# Patient Record
Sex: Female | Born: 1960
Health system: Southern US, Community
[De-identification: ages and names within clinical notes are randomized; demographics above are authoritative.]

## PROBLEM LIST (undated history)

## (undated) DIAGNOSIS — K219 Gastro-esophageal reflux disease without esophagitis: Secondary | ICD-10-CM

## (undated) DIAGNOSIS — M1990 Unspecified osteoarthritis, unspecified site: Secondary | ICD-10-CM

## (undated) DIAGNOSIS — K224 Dyskinesia of esophagus: Secondary | ICD-10-CM

## (undated) DIAGNOSIS — Z973 Presence of spectacles and contact lenses: Secondary | ICD-10-CM

## (undated) HISTORY — PX: COLONOSCOPY: SHX174

---

## 1999-02-25 HISTORY — PX: AUGMENTATION MAMMAPLASTY: SUR837

## 2003-02-25 HISTORY — PX: BREAST ENHANCEMENT SURGERY: SHX7

## 2006-07-03 ENCOUNTER — Other Ambulatory Visit: Admission: RE | Admit: 2006-07-03 | Discharge: 2006-07-03 | Payer: Self-pay | Admitting: Family Medicine

## 2006-08-04 ENCOUNTER — Encounter: Admission: RE | Admit: 2006-08-04 | Discharge: 2006-08-04 | Payer: Self-pay | Admitting: Family Medicine

## 2006-08-10 ENCOUNTER — Encounter: Admission: RE | Admit: 2006-08-10 | Discharge: 2006-08-10 | Payer: Self-pay | Admitting: Family Medicine

## 2007-02-09 ENCOUNTER — Encounter: Admission: RE | Admit: 2007-02-09 | Discharge: 2007-02-09 | Payer: Self-pay | Admitting: Family Medicine

## 2007-10-08 ENCOUNTER — Emergency Department (HOSPITAL_COMMUNITY): Admission: EM | Admit: 2007-10-08 | Discharge: 2007-10-08 | Payer: Self-pay | Admitting: Emergency Medicine

## 2008-05-29 ENCOUNTER — Encounter: Admission: RE | Admit: 2008-05-29 | Discharge: 2008-05-29 | Payer: Self-pay | Admitting: Family Medicine

## 2008-12-21 ENCOUNTER — Encounter: Admission: RE | Admit: 2008-12-21 | Discharge: 2008-12-21 | Payer: Self-pay | Admitting: Family Medicine

## 2009-06-25 ENCOUNTER — Encounter: Admission: RE | Admit: 2009-06-25 | Discharge: 2009-06-25 | Payer: Self-pay | Admitting: Family Medicine

## 2009-09-19 ENCOUNTER — Other Ambulatory Visit: Admission: RE | Admit: 2009-09-19 | Discharge: 2009-09-19 | Payer: Self-pay | Admitting: Obstetrics and Gynecology

## 2010-03-17 ENCOUNTER — Encounter: Payer: Self-pay | Admitting: Otolaryngology

## 2010-03-17 ENCOUNTER — Encounter: Payer: Self-pay | Admitting: Family Medicine

## 2010-07-23 ENCOUNTER — Other Ambulatory Visit: Payer: Self-pay | Admitting: Family Medicine

## 2010-07-23 DIAGNOSIS — Z1231 Encounter for screening mammogram for malignant neoplasm of breast: Secondary | ICD-10-CM

## 2010-08-14 ENCOUNTER — Ambulatory Visit
Admission: RE | Admit: 2010-08-14 | Discharge: 2010-08-14 | Disposition: A | Discharge status: Home / Self Care | Payer: 59 | Source: Ambulatory Visit | Attending: Family Medicine | Admitting: Family Medicine

## 2010-08-14 DIAGNOSIS — Z1231 Encounter for screening mammogram for malignant neoplasm of breast: Secondary | ICD-10-CM

## 2011-10-13 ENCOUNTER — Other Ambulatory Visit: Payer: Self-pay | Admitting: Family Medicine

## 2011-10-13 DIAGNOSIS — Z1231 Encounter for screening mammogram for malignant neoplasm of breast: Secondary | ICD-10-CM

## 2011-10-30 ENCOUNTER — Ambulatory Visit
Admission: RE | Admit: 2011-10-30 | Discharge: 2011-10-30 | Disposition: A | Discharge status: Home / Self Care | Payer: 59 | Source: Ambulatory Visit | Attending: Family Medicine | Admitting: Family Medicine

## 2011-10-30 DIAGNOSIS — Z1231 Encounter for screening mammogram for malignant neoplasm of breast: Secondary | ICD-10-CM

## 2012-01-07 ENCOUNTER — Other Ambulatory Visit (HOSPITAL_COMMUNITY): Payer: Self-pay | Admitting: Orthopedic Surgery

## 2012-01-07 DIAGNOSIS — M25512 Pain in left shoulder: Secondary | ICD-10-CM

## 2012-01-14 ENCOUNTER — Ambulatory Visit (HOSPITAL_COMMUNITY)
Admission: RE | Admit: 2012-01-14 | Discharge: 2012-01-14 | Disposition: A | Discharge status: Home / Self Care | Payer: 59 | Source: Ambulatory Visit | Attending: Orthopedic Surgery | Admitting: Orthopedic Surgery

## 2012-01-14 DIAGNOSIS — S46819A Strain of other muscles, fascia and tendons at shoulder and upper arm level, unspecified arm, initial encounter: Secondary | ICD-10-CM | POA: Insufficient documentation

## 2012-01-14 DIAGNOSIS — M25519 Pain in unspecified shoulder: Secondary | ICD-10-CM | POA: Insufficient documentation

## 2012-01-14 DIAGNOSIS — X58XXXA Exposure to other specified factors, initial encounter: Secondary | ICD-10-CM | POA: Insufficient documentation

## 2012-01-14 DIAGNOSIS — M25512 Pain in left shoulder: Secondary | ICD-10-CM

## 2012-01-14 MED ORDER — GADOBENATE DIMEGLUMINE 529 MG/ML IV SOLN
0.1000 mL | Freq: Once | INTRAVENOUS | Status: AC | PRN
  Administered 2012-01-14: 0.1 mL via INTRAVENOUS

## 2012-01-14 MED ORDER — IOHEXOL 180 MG/ML  SOLN
10.0000 mL | Freq: Once | INTRAMUSCULAR | Status: AC | PRN
  Administered 2012-01-14: 10 mL via INTRA_ARTICULAR

## 2012-01-29 ENCOUNTER — Encounter (HOSPITAL_BASED_OUTPATIENT_CLINIC_OR_DEPARTMENT_OTHER): Payer: Self-pay | Admitting: *Deleted

## 2012-01-29 ENCOUNTER — Other Ambulatory Visit: Payer: Self-pay | Admitting: Orthopedic Surgery

## 2012-01-29 NOTE — Progress Notes (Signed)
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

## 2012-02-02 ENCOUNTER — Ambulatory Visit (HOSPITAL_BASED_OUTPATIENT_CLINIC_OR_DEPARTMENT_OTHER): Payer: 59 | Admitting: Anesthesiology

## 2012-02-02 ENCOUNTER — Encounter (HOSPITAL_BASED_OUTPATIENT_CLINIC_OR_DEPARTMENT_OTHER)
Admission: RE | Disposition: A | Discharge status: Home / Self Care | Payer: Self-pay | Source: Ambulatory Visit | Attending: Orthopedic Surgery

## 2012-02-02 ENCOUNTER — Encounter (HOSPITAL_BASED_OUTPATIENT_CLINIC_OR_DEPARTMENT_OTHER): Payer: Self-pay | Admitting: Anesthesiology

## 2012-02-02 ENCOUNTER — Encounter (HOSPITAL_BASED_OUTPATIENT_CLINIC_OR_DEPARTMENT_OTHER): Payer: Self-pay | Admitting: *Deleted

## 2012-02-02 ENCOUNTER — Ambulatory Visit (HOSPITAL_BASED_OUTPATIENT_CLINIC_OR_DEPARTMENT_OTHER)
Admission: RE | Admit: 2012-02-02 | Discharge: 2012-02-02 | Disposition: A | Discharge status: Home / Self Care | Payer: 59 | Source: Ambulatory Visit | Attending: Orthopedic Surgery | Admitting: Orthopedic Surgery

## 2012-02-02 DIAGNOSIS — Z9889 Other specified postprocedural states: Secondary | ICD-10-CM

## 2012-02-02 DIAGNOSIS — S43429A Sprain of unspecified rotator cuff capsule, initial encounter: Secondary | ICD-10-CM | POA: Insufficient documentation

## 2012-02-02 DIAGNOSIS — X58XXXA Exposure to other specified factors, initial encounter: Secondary | ICD-10-CM | POA: Insufficient documentation

## 2012-02-02 DIAGNOSIS — Z5333 Arthroscopic surgical procedure converted to open procedure: Secondary | ICD-10-CM | POA: Insufficient documentation

## 2012-02-02 HISTORY — PX: SHOULDER ARTHROSCOPY WITH SUBACROMIAL DECOMPRESSION, ROTATOR CUFF REPAIR AND BICEP TENDON REPAIR: SHX5687

## 2012-02-02 SURGERY — SHOULDER ARTHROSCOPY WITH SUBACROMIAL DECOMPRESSION, ROTATOR CUFF REPAIR AND BICEP TENDON REPAIR
Anesthesia: General | Site: Shoulder | Laterality: Left | Wound class: Clean

## 2012-02-02 MED ORDER — FENTANYL CITRATE 0.05 MG/ML IJ SOLN
INTRAMUSCULAR | Status: DC | PRN
  Administered 2012-02-02: 50 ug via INTRAVENOUS

## 2012-02-02 MED ORDER — PROPOFOL 10 MG/ML IV BOLUS
INTRAVENOUS | Status: DC | PRN
  Administered 2012-02-02: 150 mg via INTRAVENOUS

## 2012-02-02 MED ORDER — MIDAZOLAM HCL 5 MG/5ML IJ SOLN
INTRAMUSCULAR | Status: DC | PRN
  Administered 2012-02-02: 2 mg via INTRAVENOUS

## 2012-02-02 MED ORDER — OXYCODONE HCL 5 MG PO TABS: 5.0000 mg | ORAL_TABLET | Freq: Once | ORAL | Status: DC | PRN

## 2012-02-02 MED ORDER — FENTANYL CITRATE 0.05 MG/ML IJ SOLN: INTRAMUSCULAR | Status: DC | PRN

## 2012-02-02 MED ORDER — SODIUM CHLORIDE 0.9 % IR SOLN
Status: DC | PRN
  Administered 2012-02-02: 6000 mL

## 2012-02-02 MED ORDER — BUPIVACAINE-EPINEPHRINE PF 0.5-1:200000 % IJ SOLN
INTRAMUSCULAR | Status: DC | PRN
  Administered 2012-02-02: 25 mL

## 2012-02-02 MED ORDER — MIDAZOLAM HCL 2 MG/2ML IJ SOLN
1.0000 mg | INTRAMUSCULAR | Status: DC | PRN
  Administered 2012-02-02: 2 mg via INTRAVENOUS

## 2012-02-02 MED ORDER — HYDROMORPHONE HCL PF 1 MG/ML IJ SOLN: 0.2500 mg | INTRAMUSCULAR | Status: DC | PRN

## 2012-02-02 MED ORDER — LIDOCAINE HCL (CARDIAC) 20 MG/ML IV SOLN
INTRAVENOUS | Status: DC | PRN
  Administered 2012-02-02: 50 mg via INTRAVENOUS

## 2012-02-02 MED ORDER — LACTATED RINGERS IV SOLN
INTRAVENOUS | Status: DC
  Administered 2012-02-02 (×2): via INTRAVENOUS

## 2012-02-02 MED ORDER — PROMETHAZINE HCL 25 MG/ML IJ SOLN: 6.2500 mg | INTRAMUSCULAR | Status: DC | PRN

## 2012-02-02 MED ORDER — DEXAMETHASONE SODIUM PHOSPHATE 4 MG/ML IJ SOLN
INTRAMUSCULAR | Status: DC | PRN
  Administered 2012-02-02: 10 mg via INTRAVENOUS
  Administered 2012-02-02: 4 mg via INTRAVENOUS

## 2012-02-02 MED ORDER — SUCCINYLCHOLINE CHLORIDE 20 MG/ML IJ SOLN
INTRAMUSCULAR | Status: DC | PRN
  Administered 2012-02-02: 100 mg via INTRAVENOUS

## 2012-02-02 MED ORDER — OXYCODONE-ACETAMINOPHEN 5-325 MG PO TABS: 1.0000 | ORAL_TABLET | ORAL | Status: DC | PRN

## 2012-02-02 MED ORDER — FENTANYL CITRATE 0.05 MG/ML IJ SOLN
100.0000 ug | Freq: Once | INTRAMUSCULAR | Status: AC
  Administered 2012-02-02: 100 ug via INTRAVENOUS

## 2012-02-02 MED ORDER — OXYCODONE HCL 5 MG/5ML PO SOLN: 5.0000 mg | Freq: Once | ORAL | Status: DC | PRN

## 2012-02-02 MED ORDER — MEPERIDINE HCL 25 MG/ML IJ SOLN: 6.2500 mg | INTRAMUSCULAR | Status: DC | PRN

## 2012-02-02 MED ORDER — MIDAZOLAM HCL 2 MG/2ML IJ SOLN: 1.0000 mg | INTRAMUSCULAR | Status: DC | PRN

## 2012-02-02 MED ORDER — FENTANYL CITRATE 0.05 MG/ML IJ SOLN: 50.0000 ug | INTRAMUSCULAR | Status: DC | PRN

## 2012-02-02 MED ORDER — CEFAZOLIN SODIUM-DEXTROSE 2-3 GM-% IV SOLR
INTRAVENOUS | Status: DC | PRN
  Administered 2012-02-02: 2 g via INTRAVENOUS

## 2012-02-02 SURGICAL SUPPLY — 86 items
ADH SKN CLS APL DERMABOND .7 (GAUZE/BANDAGES/DRESSINGS)
ANCH SUT 2 FT CRKSW 14.7X5.5 (Anchor) ×1 IMPLANT
ANCHOR CORKSCREW FIBER 5.5X15 (Anchor) ×1 IMPLANT
APL SKNCLS STERI-STRIP NONHPOA (GAUZE/BANDAGES/DRESSINGS)
BENZOIN TINCTURE PRP APPL 2/3 (GAUZE/BANDAGES/DRESSINGS) IMPLANT
BLADE SURG 15 STRL LF DISP TIS (BLADE) IMPLANT
BLADE SURG 15 STRL SS (BLADE) ×2
BLADE SURG ROTATE 9660 (MISCELLANEOUS) IMPLANT
BLADE VORTEX 6.0 (BLADE) IMPLANT
BUR OVAL 4.0 (BURR) ×2 IMPLANT
CANISTER OMNI JUG 16 LITER (MISCELLANEOUS) ×2 IMPLANT
CANISTER SUCTION 2500CC (MISCELLANEOUS) IMPLANT
CANNULA 5.75X71 LONG (CANNULA) ×2 IMPLANT
CANNULA TWIST IN 8.25X7CM (CANNULA) IMPLANT
CHLORAPREP W/TINT 26ML (MISCELLANEOUS) ×2 IMPLANT
CLOTH BEACON ORANGE TIMEOUT ST (SAFETY) ×2 IMPLANT
DECANTER SPIKE VIAL GLASS SM (MISCELLANEOUS) IMPLANT
DERMABOND ADVANCED (GAUZE/BANDAGES/DRESSINGS)
DERMABOND ADVANCED .7 DNX12 (GAUZE/BANDAGES/DRESSINGS) IMPLANT
DRAPE INCISE IOBAN 66X45 STRL (DRAPES) ×2 IMPLANT
DRAPE STERI 35X30 U-POUCH (DRAPES) ×2 IMPLANT
DRAPE SURG 17X23 STRL (DRAPES) ×2 IMPLANT
DRAPE U 20/CS (DRAPES) ×2 IMPLANT
DRAPE U-SHAPE 47X51 STRL (DRAPES) ×2 IMPLANT
DRAPE U-SHAPE 76X120 STRL (DRAPES) ×4 IMPLANT
DRSG PAD ABDOMINAL 8X10 ST (GAUZE/BANDAGES/DRESSINGS) ×2 IMPLANT
ELECT REM PT RETURN 9FT ADLT (ELECTROSURGICAL) ×2
ELECTRODE REM PT RTRN 9FT ADLT (ELECTROSURGICAL) ×1 IMPLANT
GAUZE SPONGE 4X4 16PLY XRAY LF (GAUZE/BANDAGES/DRESSINGS) IMPLANT
GAUZE XEROFORM 1X8 LF (GAUZE/BANDAGES/DRESSINGS) ×2 IMPLANT
GLOVE BIO SURGEON STRL SZ7 (GLOVE) ×2 IMPLANT
GLOVE BIO SURGEON STRL SZ7.5 (GLOVE) ×3 IMPLANT
GLOVE BIOGEL PI IND STRL 7.0 (GLOVE) ×1 IMPLANT
GLOVE BIOGEL PI IND STRL 8 (GLOVE) ×2 IMPLANT
GLOVE BIOGEL PI INDICATOR 7.0 (GLOVE) ×2
GLOVE BIOGEL PI INDICATOR 8 (GLOVE) ×1
GLOVE ECLIPSE 6.5 STRL STRAW (GLOVE) ×1 IMPLANT
GOWN PREVENTION PLUS XLARGE (GOWN DISPOSABLE) ×4 IMPLANT
LASSO CRESCENT QUICKPASS (SUTURE) ×1 IMPLANT
NDL 1/2 CIR CATGUT .05X1.09 (NEEDLE) IMPLANT
NDL SCORPION MULTI FIRE (NEEDLE) IMPLANT
NDL SUT 6 .5 CRC .975X.05 MAYO (NEEDLE) IMPLANT
NEEDLE 1/2 CIR CATGUT .05X1.09 (NEEDLE) IMPLANT
NEEDLE MAYO TAPER (NEEDLE)
NEEDLE SCORPION MULTI FIRE (NEEDLE) ×2 IMPLANT
NS IRRIG 1000ML POUR BTL (IV SOLUTION) IMPLANT
PACK ARTHROSCOPY DSU (CUSTOM PROCEDURE TRAY) ×2 IMPLANT
PACK BASIN DAY SURGERY FS (CUSTOM PROCEDURE TRAY) ×2 IMPLANT
PENCIL BUTTON HOLSTER BLD 10FT (ELECTRODE) ×1 IMPLANT
PUSHLOCK BIOCOMP 4.5X24 (Orthopedic Implant) ×1 IMPLANT
RESECTOR FULL RADIUS 4.2MM (BLADE) ×2 IMPLANT
SLEEVE SCD COMPRESS KNEE MED (MISCELLANEOUS) ×2 IMPLANT
SLING ARM FOAM STRAP LRG (SOFTGOODS) IMPLANT
SLING ARM FOAM STRAP MED (SOFTGOODS) IMPLANT
SLING ARM FOAM STRAP XLG (SOFTGOODS) IMPLANT
SLING ARM IMMOBILIZER LRG (SOFTGOODS) ×1 IMPLANT
SLING ARM IMMOBILIZER MED (SOFTGOODS) ×1 IMPLANT
SPONGE GAUZE 4X4 12PLY (GAUZE/BANDAGES/DRESSINGS) ×2 IMPLANT
SPONGE LAP 4X18 X RAY DECT (DISPOSABLE) ×1 IMPLANT
STRIP CLOSURE SKIN 1/2X4 (GAUZE/BANDAGES/DRESSINGS) IMPLANT
SUCTION FRAZIER TIP 10 FR DISP (SUCTIONS) ×1 IMPLANT
SUPPORT WRAP ARM LG (MISCELLANEOUS) ×1 IMPLANT
SUT 2 FIBERLOOP 20 STRT BLUE (SUTURE) ×2
SUT BONE WAX W31G (SUTURE) IMPLANT
SUT ETHIBOND 2 OS 4 DA (SUTURE) IMPLANT
SUT ETHILON 3 0 PS 1 (SUTURE) ×2 IMPLANT
SUT ETHILON 4 0 PS 2 18 (SUTURE) IMPLANT
SUT FIBERWIRE #2 38 T-5 BLUE (SUTURE)
SUT MNCRL AB 3-0 PS2 18 (SUTURE) IMPLANT
SUT MNCRL AB 4-0 PS2 18 (SUTURE) IMPLANT
SUT PDS AB 0 CT 36 (SUTURE) ×1 IMPLANT
SUT PROLENE 3 0 PS 2 (SUTURE) IMPLANT
SUT VIC AB 0 CT1 18XCR BRD 8 (SUTURE) IMPLANT
SUT VIC AB 0 CT1 8-18 (SUTURE)
SUT VIC AB 2-0 SH 18 (SUTURE) ×1 IMPLANT
SUTURE 2 FIBERLOOP 20 STRT BLU (SUTURE) IMPLANT
SUTURE FIBERWR #2 38 T-5 BLUE (SUTURE) IMPLANT
SYR BULB 3OZ (MISCELLANEOUS) IMPLANT
TAPE FIBER 2MM 7IN #2 BLUE (SUTURE) ×1 IMPLANT
TOWEL OR 17X24 6PK STRL BLUE (TOWEL DISPOSABLE) ×2 IMPLANT
TOWEL OR NON WOVEN STRL DISP B (DISPOSABLE) ×2 IMPLANT
TUBE CONNECTING 20X1/4 (TUBING) ×2 IMPLANT
TUBING ARTHROSCOPY IRRIG 16FT (MISCELLANEOUS) ×2 IMPLANT
WAND STAR VAC 90 (SURGICAL WAND) ×2 IMPLANT
WATER STERILE IRR 1000ML POUR (IV SOLUTION) ×2 IMPLANT
YANKAUER SUCT BULB TIP NO VENT (SUCTIONS) ×1 IMPLANT

## 2012-02-02 NOTE — H&P (Signed)
Erin Lucero is an 51 y.o. female.   Chief Complaint: Left shoulder pain HPI: The patient injured her left shoulder more than 4 months ago. She tried rest injection therapy and exercises without relief. An MRI revealed small full-thickness tear and anterior rotator cuff and by my read was also concerning for the possibility of the superior labral tear. She was indicated for surgical treatment to repair the rotator cuff and carefully examined the biceps and labrum with treatment as indicated.  Past Medical History  Diagnosis Date  . No pertinent past medical history     Past Surgical History  Procedure Date  . Breast enhancement surgery 2005  . Colonoscopy     History reviewed. No pertinent family history. Social History:  reports that she has never smoked. She does not have any smokeless tobacco history on file. She reports that she drinks alcohol. She reports that she does not use illicit drugs.  Allergies: No Known Allergies  Medications Prior to Admission  Medication Sig Dispense Refill  . buPROPion (WELLBUTRIN XL) 300 MG 24 hr tablet Take 300 mg by mouth daily.        No results found for this or any previous visit (from the past 48 hour(s)). No results found.  Review of Systems  All other systems reviewed and are negative.    Blood pressure 128/75, pulse 67, temperature 98 F (36.7 C), temperature source Oral, resp. rate 16, height 5' 8.5" (1.74 m), weight 69.037 kg (152 lb 3.2 oz), SpO2 99.00%. Physical Exam  Constitutional: She is oriented to person, place, and time. She appears well-developed and well-nourished.  HENT:  Head: Atraumatic.  Eyes: EOM are normal.  Cardiovascular: Intact distal pulses.   Respiratory: Effort normal.  Musculoskeletal:       Left shoulder: She exhibits decreased range of motion and tenderness.  Neurological: She is alert and oriented to person, place, and time.  Skin: Skin is warm and dry.  Psychiatric: She has a normal mood and  affect.     Assessment/Plan Left shoulder small full-thickness rotator cuff tear and possible labral/biceps injury Plan for arthroscopic examination with treatment as indicated with repair versus debridement of the rotator cuff and possible subpectoral biceps tenodesis Risks / benefits of surgery discussed Consent on chart  NPO for OR Preop antibiotics   Elaine Roanhorse WILLIAM 02/02/2012, 1:29 PM

## 2012-02-02 NOTE — Anesthesia Postprocedure Evaluation (Signed)
  Anesthesia Post-op Note  Patient: Erin Lucero  Procedure(s) Performed: Procedure(s) (LRB) with comments: SHOULDER ARTHROSCOPY WITH SUBACROMIAL DECOMPRESSION, ROTATOR CUFF REPAIR AND BICEP TENDON REPAIR (Left) - Possible Bicep Tendondesis, open tenodesis  Patient Location: PACU  Anesthesia Type:GA combined with regional for post-op pain  Level of Consciousness: awake  Airway and Oxygen Therapy: Patient Spontanous Breathing  Post-op Pain: none  Post-op Assessment: Post-op Vital signs reviewed  Post-op Vital Signs: stable  Complications: No apparent anesthesia complications

## 2012-02-02 NOTE — Transfer of Care (Signed)
Immediate Anesthesia Transfer of Care Note  Patient: Erin Lucero  Procedure(s) Performed: Procedure(s) (LRB) with comments: SHOULDER ARTHROSCOPY WITH SUBACROMIAL DECOMPRESSION, ROTATOR CUFF REPAIR AND BICEP TENDON REPAIR (Left) - Possible Bicep Tendondesis, open tenodesis  Patient Location: PACU  Anesthesia Type:General and Regional  Level of Consciousness: awake, alert  and oriented  Airway & Oxygen Therapy: Patient Spontanous Breathing and Patient connected to face mask oxygen  Post-op Assessment: Report given to PACU RN and Post -op Vital signs reviewed and stable  Post vital signs: Reviewed and stable  Complications: No apparent anesthesia complications

## 2012-02-02 NOTE — Progress Notes (Signed)
Assisted Dr. Massagee with left, ultrasound guided, interscalene  block. Side rails up, monitors on throughout procedure. See vital signs in flow sheet. Tolerated Procedure well. 

## 2012-02-02 NOTE — Op Note (Signed)
Procedure(s): SHOULDER ARTHROSCOPY WITH SUBACROMIAL DECOMPRESSION, ROTATOR CUFF REPAIR AND BICEP TENDON REPAIR Procedure Note  Erin Lucero female 51 y.o. 02/02/2012  Procedure(s) and Anesthesia Type:    * #1 Left shoulder arthroscopic rotator cuff repair     #2 left shoulder arthroscopic subacromial decompression     #3 left shoulder arthroscopic debridement of partial-thickness subscapularis tear and biceps tenotomy      #4 Left shoulder open subpectoral biceps tenodesis  Surgeon(s) and Role:    * Mable Paris, MD - Primary     Surgeon: Mable Paris   Assistants: Damita Lack PA-C [facility] was present and scrubbed throughout the procedure and was essential in positioning, camera work, assistance with instrumentation and closure)  Anesthesia: General endotracheal anesthesia with preoperative interscalene block    Procedure Detail  Estimated Blood Loss: Min         Drains: none  Blood Given: none         Specimens: none        Complications:  * No complications entered in OR log *         Disposition: PACU - hemodynamically stable.         Condition: stable    Procedure:   INDICATIONS FOR SURGERY: The patient is 51 y.o. female who has had several months of left shoulder pain after an injury. She failed conservative treatment with exercises, rest, anti-inflammatory medications, injection therapy and went on to have an MRI which revealed small full thickness anterior rotator cuff tear and by my read was concerning for biceps tendon and biceps anchor pathology. She was indicated for surgical treatment to decrease pain and increase function. She understood risks benefits alternatives to the procedure including but not limited to risk of bleeding infection, damage to neurovascular structures, nonhealing,  Or re tear.  OPERATIVE FINDINGS: Examination under anesthesia: No stiffness or instability Diagnostic Arthroscopy:  Glenoid articular  cartilage: Intact Humeral head articular cartilage: Intact Labrum: Intact Loose bodies: None Synovitis: Moderate Articular sided rotator cuff: She had some partial-thickness undersurface elevation at about 45 mm of exposed articular tuberosity. The area that appeared thinnest was just posterior to the biceps tendon and was tagged with a PDS suture. Bursal sided rotator cuff: Near full thickness anterior tear just posterior to the biceps tendon which was connected to the high grade undersurface partial tearing and repaired using one 5.5 mm peak corkscrew anchor and the medial row and one 4.5 mm push lock anchor in the lateral row. Coracoacromial ligament: Severely frayed indicating impingement. She had a moderate size anterior acromial spur which was downsloping and was addressed with a standard acromioplasty.  DESCRIPTION OF PROCEDURE: The patient was identified in preoperative  holding area where I personally marked the operative site after  verifying site, side, and procedure with the patient. An interscalene block was given by the attending anesthesiologist the holding area.  The patient was taken back to the operating room where general anesthesia was induced without complication and was placed in the beach-chair position with the back  elevated about 60 degrees and all extremities and head and neck carefully padded and  positioned.   The left upper extremity was then prepped and  draped in a standard sterile fashion. The appropriate time-out  procedure was carried out. The patient did receive IV antibiotics  within 30 minutes of incision.   A small posterior portal incision was made and the arthroscope was introduced into the joint. An anterior portal was then established above the  subscapularis using needle localization. Small cannula was placed anteriorly. Diagnostic arthroscopy was then carried out with findings as described above.  Immediately noted was a partial-thickness  subscapularis tear of the undersurface of the upper border. This was debrided back to stable healthy tendon and there was noted to be no significant detachment of the tendon off its insertion on the lesser tuberosity. Therefore no formal repair was necessary. The biceps anchor was noted to be detached with subluxation superior labrum into the joint. There was also noted to be partial tearing of the long .head of the biceps. Therefore a large biter was used through the anterior intra-articular portal to perform a biceps tenotomy. The remaining superior labrum was extensively debrided with shaver back to healthy labrum which did not subluxate into the joint.  At this point th e undersurface of the rotator cuff was carefully examined. She was noted to havepartial-thickness tearing with exposure of about 4-5 mm of the greater tuberosity throughout the entire supraspinatus. Infraspinatus and teres minor were intact. The thinnest area anteriorly was tagged with a PDS suture which was placed percutaneously using a spinal needle. The partial undersurface tearing was extensively debrided with the shaver in the joint   The arthroscope was then introduced into the subacromial space a standard lateral portal was established with needle localization. The shaver was used through the lateral portal to perform extensive bursectomy. Coracoacromial ligament was examined and found to be  severely frayed indicating chronic impingement and there was a great deal of bursitis. Marland Kitchen   after extensive bursectomy the PDS suture was identified and was in an area of significant high-grade partial-thickness tearing with a large flap of anterior supraspinatus elevated. This represented about 1 cm to 1 1/2 cm. The tear was debrided and the remaining medial strand of tendon was taken down to complete the tear. The biceps tendon was identified just anterior to the tear. Once the tuberosity was exposed a bur was used to get down to bleeding bone to  promote healing. 5.5 mm peak corkscrew anchor was then placed percutaneously off the lateral edge of the acromion just off of the articular margin and each suture was passed using a scorpion suture passer through good healthy tendon evenly spaced with horizontal mattress sutures. These were tied down bringing the medial row down. All 4 sutures were then placed in a 4.5 mm push lock anchor laterally bringing the tendon nicely down over the prepared tuberosity. The repair was felt to be excellent.   The coracoacromial ligament was taken down off the anterior acromion with the ArthroCare exposing a  moderate hooked  anterior acromial spur. A high-speed bur was then used through the lateral portal to take down the anterior acromial spur from lateral to medial in a standard acromioplasty.  The acromioplasty was also viewed from the lateral portal and the bur was used as necessary to ensure that the acromion was completely flat from posterior to anterior.  The arthroscopic equipment was removed from the joint and the portals were closed with 3-0 nylon in an interrupted fashion.    Attention was then turned to the axilla where a approximately 3 cm incision was made in the dominant axillary fold. This was about 50% above and 50% below the palpable lower border of the pectoralis major. Dissection was carried out between the lower border of the pectoralis major and the short head of the biceps muscle belly. The anterior humerus was then exposed and the long head biceps was delivered out through  the wound. The biceps was prepared using a #2 FiberWire fiber loop and the remaining portion of the biceps tendon was discarded after choosing the appropriate tension and length. A drill bit slightly smaller than the tendon was used in the distal bicipital groove to create an intramedullary hole and then a drill bit about 12 mm distal to that was used which was slightly larger than the suture passer needle. A crescent suture  lasso was then used to pass the sutures from proximal to distal and then one suture was brought around medial and lateral to the tendon. It was tensioned, dunking the tendon into the intramedullary canal and tied over the anterior portion of the tendon. The tension was felt to be appropriate. The wound was copiously irrigated with normal saline and subsequently closed in layers with 2-0 Vicryl in the deep dermal layer and Dermabond for skin closure.  Sterile dressings were then applied including Xeroform 4 x 4's ABDs and tape. The patient was then allowed to awaken from general anesthesia, placed in a sling, transferred to the stretcher and taken to the recovery room in stable condition.   POSTOPERATIVE PLAN: The patient will be discharged home today and will followup in one week for suture removal . and wound check.  she will follow the standard cuff repair protocol with no active elbow flexion for about 6 weeks

## 2012-02-02 NOTE — Progress Notes (Signed)
Iv restarted by Suann Larry, RN after iv in right forearm infiltrated

## 2012-02-02 NOTE — Anesthesia Procedure Notes (Addendum)
Anesthesia Regional Block:  Supraclavicular block  Pre-Anesthetic Checklist: ,, timeout performed, Correct Patient, Correct Site, Correct Laterality, Correct Procedure, Correct Position, site marked, Risks and benefits discussed,  Surgical consent,  Pre-op evaluation,  At surgeon's request and post-op pain management  Laterality: Left and Upper  Prep: chloraprep       Needles:   Needle Type: Echogenic Needle      Needle Gauge: 22 and 22 G  Needle insertion depth: 3 cm   Additional Needles:  Procedures: ultrasound guided (picture in chart) and nerve stimulator Supraclavicular block Narrative:  Start time: 02/02/2012 1:20 PM End time: 02/02/2012 1:40 PM Injection made incrementally with aspirations every 5 mL.  Performed by: Personally  Anesthesiologist: T Massagee  Additional Notes: Tolerated well   Procedure Name: Intubation Date/Time: 02/02/2012 2:06 PM Performed by: Zenia Resides D Pre-anesthesia Checklist: Patient identified, Emergency Drugs available, Suction available, Patient being monitored and Timeout performed Patient Re-evaluated:Patient Re-evaluated prior to inductionOxygen Delivery Method: Circle System Utilized Preoxygenation: Pre-oxygenation with 100% oxygen Intubation Type: IV induction Ventilation: Mask ventilation without difficulty Laryngoscope Size: Mac and 3 Grade View: Grade I Tube type: Oral Number of attempts: 1 Airway Equipment and Method: stylet and oral airway Placement Confirmation: ETT inserted through vocal cords under direct vision,  positive ETCO2 and breath sounds checked- equal and bilateral Secured at: 22 cm Tube secured with: Tape Dental Injury: Teeth and Oropharynx as per pre-operative assessment

## 2012-02-02 NOTE — Anesthesia Preprocedure Evaluation (Signed)
Anesthesia Evaluation  Patient identified by MRN, date of birth, ID band Patient awake    Reviewed: Allergy & Precautions, H&P , NPO status , Patient's Chart, lab work & pertinent test results  History of Anesthesia Complications Negative for: history of anesthetic complications  Airway Mallampati: I  Neck ROM: full    Dental No notable dental hx. (+) Teeth Intact   Pulmonary neg pulmonary ROS,  breath sounds clear to auscultation  Pulmonary exam normal       Cardiovascular negative cardio ROS  IRhythm:regular Rate:Normal     Neuro/Psych negative neurological ROS  negative psych ROS   GI/Hepatic negative GI ROS, Neg liver ROS,   Endo/Other  negative endocrine ROS  Renal/GU negative Renal ROS  negative genitourinary   Musculoskeletal   Abdominal   Peds  Hematology negative hematology ROS (+)   Anesthesia Other Findings   Reproductive/Obstetrics negative OB ROS                           Anesthesia Physical Anesthesia Plan  ASA: I  Anesthesia Plan: General and General ETT   Post-op Pain Management: MAC Combined w/ Regional for Post-op pain   Induction:   Airway Management Planned:   Additional Equipment:   Intra-op Plan:   Post-operative Plan:   Informed Consent: I have reviewed the patients History and Physical, chart, labs and discussed the procedure including the risks, benefits and alternatives for the proposed anesthesia with the patient or authorized representative who has indicated his/her understanding and acceptance.     Plan Discussed with: CRNA and Surgeon  Anesthesia Plan Comments:         Anesthesia Quick Evaluation

## 2012-02-03 ENCOUNTER — Encounter (HOSPITAL_BASED_OUTPATIENT_CLINIC_OR_DEPARTMENT_OTHER): Payer: Self-pay | Admitting: Orthopedic Surgery

## 2012-03-12 ENCOUNTER — Ambulatory Visit: Payer: 59 | Attending: Orthopedic Surgery | Admitting: Physical Therapy

## 2012-03-12 DIAGNOSIS — M25519 Pain in unspecified shoulder: Secondary | ICD-10-CM | POA: Insufficient documentation

## 2012-03-12 DIAGNOSIS — IMO0001 Reserved for inherently not codable concepts without codable children: Secondary | ICD-10-CM | POA: Insufficient documentation

## 2012-03-12 DIAGNOSIS — M25619 Stiffness of unspecified shoulder, not elsewhere classified: Secondary | ICD-10-CM | POA: Insufficient documentation

## 2012-03-16 ENCOUNTER — Ambulatory Visit: Payer: 59 | Admitting: Rehabilitation

## 2012-03-19 ENCOUNTER — Ambulatory Visit: Payer: 59 | Admitting: Rehabilitation

## 2012-03-22 ENCOUNTER — Ambulatory Visit: Payer: 59 | Admitting: Rehabilitation

## 2012-03-26 ENCOUNTER — Ambulatory Visit: Payer: 59 | Admitting: Physical Therapy

## 2012-03-29 ENCOUNTER — Ambulatory Visit: Payer: 59 | Attending: Orthopedic Surgery | Admitting: Rehabilitation

## 2012-03-29 DIAGNOSIS — M25519 Pain in unspecified shoulder: Secondary | ICD-10-CM | POA: Insufficient documentation

## 2012-03-29 DIAGNOSIS — IMO0001 Reserved for inherently not codable concepts without codable children: Secondary | ICD-10-CM | POA: Insufficient documentation

## 2012-03-29 DIAGNOSIS — M25619 Stiffness of unspecified shoulder, not elsewhere classified: Secondary | ICD-10-CM | POA: Insufficient documentation

## 2012-04-05 ENCOUNTER — Ambulatory Visit: Payer: 59 | Admitting: Rehabilitation

## 2012-04-09 ENCOUNTER — Ambulatory Visit: Payer: 59 | Admitting: Physical Therapy

## 2012-04-16 ENCOUNTER — Encounter: Payer: 59 | Admitting: Rehabilitation

## 2012-04-20 ENCOUNTER — Ambulatory Visit: Payer: 59 | Admitting: Rehabilitation

## 2012-04-23 ENCOUNTER — Ambulatory Visit: Payer: 59 | Admitting: Physical Therapy

## 2012-04-23 ENCOUNTER — Encounter: Payer: 59 | Admitting: Physical Therapy

## 2012-04-27 ENCOUNTER — Ambulatory Visit: Payer: 59 | Attending: Orthopedic Surgery | Admitting: Physical Therapy

## 2012-04-27 DIAGNOSIS — M25619 Stiffness of unspecified shoulder, not elsewhere classified: Secondary | ICD-10-CM | POA: Insufficient documentation

## 2012-04-27 DIAGNOSIS — IMO0001 Reserved for inherently not codable concepts without codable children: Secondary | ICD-10-CM | POA: Insufficient documentation

## 2012-04-27 DIAGNOSIS — M25519 Pain in unspecified shoulder: Secondary | ICD-10-CM | POA: Insufficient documentation

## 2012-04-29 ENCOUNTER — Ambulatory Visit: Payer: 59 | Admitting: Rehabilitation

## 2012-04-30 ENCOUNTER — Encounter: Payer: 59 | Admitting: Rehabilitation

## 2012-05-04 ENCOUNTER — Ambulatory Visit: Payer: 59 | Admitting: Rehabilitation

## 2012-05-07 ENCOUNTER — Ambulatory Visit: Payer: 59 | Admitting: Rehabilitation

## 2012-05-11 ENCOUNTER — Ambulatory Visit: Payer: 59 | Admitting: Rehabilitation

## 2012-05-14 ENCOUNTER — Ambulatory Visit: Payer: 59 | Admitting: Physical Therapy

## 2012-05-18 ENCOUNTER — Ambulatory Visit: Payer: 59 | Admitting: Rehabilitation

## 2012-11-30 ENCOUNTER — Other Ambulatory Visit: Payer: Self-pay

## 2012-11-30 DIAGNOSIS — Z9882 Breast implant status: Secondary | ICD-10-CM

## 2012-11-30 DIAGNOSIS — Z1231 Encounter for screening mammogram for malignant neoplasm of breast: Secondary | ICD-10-CM

## 2012-12-29 ENCOUNTER — Ambulatory Visit
Admission: RE | Admit: 2012-12-29 | Discharge: 2012-12-29 | Disposition: A | Discharge status: Home / Self Care | Payer: 59 | Source: Ambulatory Visit

## 2012-12-29 DIAGNOSIS — Z1231 Encounter for screening mammogram for malignant neoplasm of breast: Secondary | ICD-10-CM

## 2012-12-29 DIAGNOSIS — Z9882 Breast implant status: Secondary | ICD-10-CM

## 2013-01-27 ENCOUNTER — Other Ambulatory Visit (HOSPITAL_COMMUNITY)
Admission: RE | Admit: 2013-01-27 | Discharge: 2013-01-27 | Disposition: A | Discharge status: Home / Self Care | Payer: 59 | Source: Ambulatory Visit | Attending: Nurse Practitioner | Admitting: Nurse Practitioner

## 2013-01-27 ENCOUNTER — Other Ambulatory Visit: Payer: Self-pay | Admitting: Nurse Practitioner

## 2013-01-27 DIAGNOSIS — Z01419 Encounter for gynecological examination (general) (routine) without abnormal findings: Secondary | ICD-10-CM | POA: Insufficient documentation

## 2013-01-27 DIAGNOSIS — Z1151 Encounter for screening for human papillomavirus (HPV): Secondary | ICD-10-CM | POA: Insufficient documentation

## 2014-01-26 ENCOUNTER — Other Ambulatory Visit: Payer: Self-pay

## 2014-01-26 DIAGNOSIS — Z1231 Encounter for screening mammogram for malignant neoplasm of breast: Secondary | ICD-10-CM

## 2014-02-15 ENCOUNTER — Ambulatory Visit
Admission: RE | Admit: 2014-02-15 | Discharge: 2014-02-15 | Disposition: A | Discharge status: Home / Self Care | Payer: 59 | Source: Ambulatory Visit

## 2014-02-15 ENCOUNTER — Other Ambulatory Visit: Payer: Self-pay

## 2014-02-15 DIAGNOSIS — Z1231 Encounter for screening mammogram for malignant neoplasm of breast: Secondary | ICD-10-CM

## 2014-06-08 ENCOUNTER — Other Ambulatory Visit: Payer: Self-pay | Admitting: Nurse Practitioner

## 2014-06-08 DIAGNOSIS — N6310 Unspecified lump in the right breast, unspecified quadrant: Secondary | ICD-10-CM

## 2014-06-08 DIAGNOSIS — Z803 Family history of malignant neoplasm of breast: Secondary | ICD-10-CM

## 2014-06-14 ENCOUNTER — Ambulatory Visit
Admission: RE | Admit: 2014-06-14 | Discharge: 2014-06-14 | Disposition: A | Discharge status: Home / Self Care | Payer: 59 | Source: Ambulatory Visit | Attending: Nurse Practitioner | Admitting: Nurse Practitioner

## 2014-06-14 ENCOUNTER — Other Ambulatory Visit: Payer: Self-pay | Admitting: Nurse Practitioner

## 2014-06-14 DIAGNOSIS — N6310 Unspecified lump in the right breast, unspecified quadrant: Secondary | ICD-10-CM

## 2014-06-14 DIAGNOSIS — Z803 Family history of malignant neoplasm of breast: Secondary | ICD-10-CM

## 2015-02-06 ENCOUNTER — Other Ambulatory Visit: Payer: Self-pay

## 2015-02-06 DIAGNOSIS — Z1231 Encounter for screening mammogram for malignant neoplasm of breast: Secondary | ICD-10-CM

## 2015-02-15 ENCOUNTER — Other Ambulatory Visit: Payer: Self-pay

## 2015-02-15 ENCOUNTER — Ambulatory Visit
Admission: RE | Admit: 2015-02-15 | Discharge: 2015-02-15 | Disposition: A | Discharge status: Home / Self Care | Payer: 59 | Source: Ambulatory Visit | Attending: Nurse Practitioner | Admitting: Nurse Practitioner

## 2015-02-15 DIAGNOSIS — Z1231 Encounter for screening mammogram for malignant neoplasm of breast: Secondary | ICD-10-CM

## 2015-07-10 ENCOUNTER — Ambulatory Visit: Payer: Self-pay | Admitting: Physician Assistant

## 2015-07-10 ENCOUNTER — Encounter: Payer: Self-pay | Admitting: Physician Assistant

## 2015-07-10 ENCOUNTER — Other Ambulatory Visit
Admission: RE | Admit: 2015-07-10 | Discharge: 2015-07-10 | Disposition: A | Discharge status: Home / Self Care | Payer: 59 | Source: Ambulatory Visit | Attending: Physician Assistant | Admitting: Physician Assistant

## 2015-07-10 VITALS — BP 120/86 | HR 60 | Temp 98.4°F

## 2015-07-10 DIAGNOSIS — M542 Cervicalgia: Secondary | ICD-10-CM | POA: Diagnosis not present

## 2015-07-10 DIAGNOSIS — R1310 Dysphagia, unspecified: Secondary | ICD-10-CM | POA: Insufficient documentation

## 2015-07-10 DIAGNOSIS — K224 Dyskinesia of esophagus: Secondary | ICD-10-CM

## 2015-07-10 DIAGNOSIS — F411 Generalized anxiety disorder: Secondary | ICD-10-CM

## 2015-07-10 LAB — TSH: TSH: 1.996 u[IU]/mL (ref 0.350–4.500)

## 2015-07-10 MED ORDER — DILTIAZEM HCL ER 180 MG PO CP24
180.0000 mg | ORAL_CAPSULE | Freq: Every day | ORAL | Status: DC
Start: 2015-07-10 — End: 2015-08-27

## 2015-07-10 MED ORDER — BUPROPION HCL ER (XL) 150 MG PO TB24: ORAL_TABLET | ORAL | Status: DC

## 2015-07-10 NOTE — Progress Notes (Signed)
S: c/o throat pain/spasms when eating, feels like food gets stuck but it only happens every now and then , no v/, eats healthy, is very stressed and doesn't know if this is a stress reaction, has a lot of stress b/n home and work, use to take wellbutrin and prozac; also mother had hx of hypothyroidism  O: vitals wnl, nad, thyroid appears smooth, no nodules palpated, not enlarged, lungs c t a, cv rrr  A: esophageal spasms, anxiety  P: thyroid panel to assess function, wellbutrin , thyroid normal levels so will call in dialtizem for esophageal spasms

## 2015-07-11 LAB — T4: T4 TOTAL: 6.1 ug/dL (ref 4.5–12.0)

## 2015-07-11 LAB — T3 UPTAKE: T3 UPTAKE RATIO: 30 % (ref 24–39)

## 2015-07-11 NOTE — Progress Notes (Signed)
Spoke with Erin Lucero informed her that TSH level was normal; still awaiting T3 and T4 result and that a calcium channel blocker is the best therapy so escribed medication to pharmacy. Patient acknowledge understanding.

## 2015-07-11 NOTE — Progress Notes (Signed)
Spoke with patient informed her that TSH level was normal but still waiting on T3 and T4 result. Also that a calcium channel blocker would be the best therapy per Darl PikesSusan. Patient acknowledge understanding

## 2015-07-24 DIAGNOSIS — J309 Allergic rhinitis, unspecified: Secondary | ICD-10-CM | POA: Diagnosis not present

## 2015-07-24 DIAGNOSIS — K219 Gastro-esophageal reflux disease without esophagitis: Secondary | ICD-10-CM | POA: Diagnosis not present

## 2015-07-24 DIAGNOSIS — F458 Other somatoform disorders: Secondary | ICD-10-CM | POA: Diagnosis not present

## 2015-07-24 DIAGNOSIS — G4700 Insomnia, unspecified: Secondary | ICD-10-CM | POA: Diagnosis not present

## 2015-08-14 DIAGNOSIS — G4700 Insomnia, unspecified: Secondary | ICD-10-CM | POA: Diagnosis not present

## 2015-08-14 DIAGNOSIS — F458 Other somatoform disorders: Secondary | ICD-10-CM | POA: Diagnosis not present

## 2015-08-24 ENCOUNTER — Other Ambulatory Visit: Payer: Self-pay

## 2015-08-24 ENCOUNTER — Telehealth: Payer: Self-pay

## 2015-08-24 NOTE — Telephone Encounter (Signed)
[facility] for GERD K21.9. Please precert. Thanks!

## 2015-08-24 NOTE — Telephone Encounter (Signed)
No pre certification is required [facility]: 43235.

## 2015-08-27 ENCOUNTER — Encounter: Payer: Self-pay | Admitting: *Deleted

## 2015-08-29 NOTE — Discharge Instructions (Signed)

## 2015-08-30 ENCOUNTER — Encounter
Admission: RE | Disposition: A | Discharge status: Home / Self Care | Payer: Self-pay | Source: Ambulatory Visit | Attending: Gastroenterology

## 2015-08-30 ENCOUNTER — Ambulatory Visit
Admission: RE | Admit: 2015-08-30 | Discharge: 2015-08-30 | Disposition: A | Discharge status: Home / Self Care | Payer: 59 | Source: Ambulatory Visit | Attending: Gastroenterology | Admitting: Gastroenterology

## 2015-08-30 ENCOUNTER — Ambulatory Visit: Payer: 59 | Admitting: Anesthesiology

## 2015-08-30 DIAGNOSIS — R1310 Dysphagia, unspecified: Secondary | ICD-10-CM | POA: Insufficient documentation

## 2015-08-30 DIAGNOSIS — K2900 Acute gastritis without bleeding: Secondary | ICD-10-CM | POA: Diagnosis not present

## 2015-08-30 DIAGNOSIS — Z9889 Other specified postprocedural states: Secondary | ICD-10-CM | POA: Diagnosis not present

## 2015-08-30 DIAGNOSIS — K219 Gastro-esophageal reflux disease without esophagitis: Secondary | ICD-10-CM | POA: Diagnosis not present

## 2015-08-30 DIAGNOSIS — Z7951 Long term (current) use of inhaled steroids: Secondary | ICD-10-CM | POA: Diagnosis not present

## 2015-08-30 DIAGNOSIS — Z9882 Breast implant status: Secondary | ICD-10-CM | POA: Diagnosis not present

## 2015-08-30 DIAGNOSIS — K449 Diaphragmatic hernia without obstruction or gangrene: Secondary | ICD-10-CM | POA: Diagnosis not present

## 2015-08-30 DIAGNOSIS — M19071 Primary osteoarthritis, right ankle and foot: Secondary | ICD-10-CM | POA: Insufficient documentation

## 2015-08-30 DIAGNOSIS — Z79899 Other long term (current) drug therapy: Secondary | ICD-10-CM | POA: Diagnosis not present

## 2015-08-30 DIAGNOSIS — R09A2 Foreign body sensation, throat: Secondary | ICD-10-CM | POA: Insufficient documentation

## 2015-08-30 DIAGNOSIS — K2970 Gastritis, unspecified, without bleeding: Secondary | ICD-10-CM | POA: Insufficient documentation

## 2015-08-30 DIAGNOSIS — F458 Other somatoform disorders: Secondary | ICD-10-CM | POA: Diagnosis not present

## 2015-08-30 HISTORY — DX: Gastro-esophageal reflux disease without esophagitis: K21.9

## 2015-08-30 HISTORY — DX: Unspecified osteoarthritis, unspecified site: M19.90

## 2015-08-30 HISTORY — DX: Presence of spectacles and contact lenses: Z97.3

## 2015-08-30 HISTORY — PX: ESOPHAGOGASTRODUODENOSCOPY (EGD) WITH PROPOFOL: SHX5813

## 2015-08-30 SURGERY — ESOPHAGOGASTRODUODENOSCOPY (EGD) WITH PROPOFOL
Anesthesia: Monitor Anesthesia Care | Wound class: Clean Contaminated

## 2015-08-30 MED ORDER — ACETAMINOPHEN 160 MG/5ML PO SOLN: 325.0000 mg | ORAL | Status: DC | PRN

## 2015-08-30 MED ORDER — PROPOFOL 10 MG/ML IV BOLUS
INTRAVENOUS | Status: DC | PRN
  Administered 2015-08-30: 30 mg via INTRAVENOUS
  Administered 2015-08-30: 20 mg via INTRAVENOUS
  Administered 2015-08-30 (×2): 30 mg via INTRAVENOUS
  Administered 2015-08-30: 70 mg via INTRAVENOUS
  Administered 2015-08-30 (×2): 30 mg via INTRAVENOUS

## 2015-08-30 MED ORDER — LACTATED RINGERS IV SOLN
INTRAVENOUS | Status: DC
  Administered 2015-08-30: 10:00:00 via INTRAVENOUS

## 2015-08-30 MED ORDER — GLYCOPYRROLATE 0.2 MG/ML IJ SOLN
INTRAMUSCULAR | Status: DC | PRN
  Administered 2015-08-30: 0.2 mg via INTRAVENOUS

## 2015-08-30 MED ORDER — ACETAMINOPHEN 325 MG PO TABS: 325.0000 mg | ORAL_TABLET | ORAL | Status: DC | PRN

## 2015-08-30 MED ORDER — LIDOCAINE HCL (CARDIAC) 20 MG/ML IV SOLN
INTRAVENOUS | Status: DC | PRN
  Administered 2015-08-30: 50 mg via INTRAVENOUS

## 2015-08-30 SURGICAL SUPPLY — 32 items
BALLN DILATOR 10-12 8 (BALLOONS)
BALLN DILATOR 12-15 8 (BALLOONS)
BALLN DILATOR 15-18 8 (BALLOONS)
BALLN DILATOR CRE 0-12 8 (BALLOONS)
BALLN DILATOR ESOPH 8 10 CRE (MISCELLANEOUS) IMPLANT
BALLOON DILATOR 12-15 8 (BALLOONS) IMPLANT
BALLOON DILATOR 15-18 8 (BALLOONS) IMPLANT
BALLOON DILATOR CRE 0-12 8 (BALLOONS) IMPLANT
BLOCK BITE 60FR ADLT L/F GRN (MISCELLANEOUS) ×2 IMPLANT
CANISTER SUCT 1200ML W/VALVE (MISCELLANEOUS) ×2 IMPLANT
CLIP HMST 235XBRD CATH ROT (MISCELLANEOUS) IMPLANT
CLIP RESOLUTION 360 11X235 (MISCELLANEOUS)
FCP ESCP3.2XJMB 240X2.8X (MISCELLANEOUS)
FORCEPS BIOP RAD 4 LRG CAP 4 (CUTTING FORCEPS) ×1 IMPLANT
FORCEPS BIOP RJ4 240 W/NDL (MISCELLANEOUS)
FORCEPS ESCP3.2XJMB 240X2.8X (MISCELLANEOUS) IMPLANT
GOWN CVR UNV OPN BCK APRN NK (MISCELLANEOUS) ×2 IMPLANT
GOWN ISOL THUMB LOOP REG UNIV (MISCELLANEOUS) ×4
INJECTOR VARIJECT VIN23 (MISCELLANEOUS) IMPLANT
KIT DEFENDO VALVE AND CONN (KITS) IMPLANT
KIT ENDO PROCEDURE OLY (KITS) ×2 IMPLANT
MARKER SPOT ENDO TATTOO 5ML (MISCELLANEOUS) IMPLANT
PAD GROUND ADULT SPLIT (MISCELLANEOUS) IMPLANT
RETRIEVER NET PLAT FOOD (MISCELLANEOUS) IMPLANT
SNARE SHORT THROW 13M SML OVAL (MISCELLANEOUS) IMPLANT
SNARE SHORT THROW 30M LRG OVAL (MISCELLANEOUS) IMPLANT
SPOT EX ENDOSCOPIC TATTOO (MISCELLANEOUS)
SYR INFLATION 60ML (SYRINGE) IMPLANT
TRAP ETRAP POLY (MISCELLANEOUS) IMPLANT
VARIJECT INJECTOR VIN23 (MISCELLANEOUS)
WATER STERILE IRR 250ML POUR (IV SOLUTION) ×2 IMPLANT
WIRE CRE 18-20MM 8CM F G (MISCELLANEOUS) IMPLANT

## 2015-08-30 NOTE — Transfer of Care (Signed)
Immediate Anesthesia Transfer of Care Note  Patient: Erin GlaserCarol K Lucero  Procedure(s) Performed: Procedure(s): ESOPHAGOGASTRODUODENOSCOPY (EGD) WITH PROPOFOL (N/A)  Patient Location: PACU  Anesthesia Type: MAC  Level of Consciousness: awake, alert  and patient cooperative  Airway and Oxygen Therapy: Patient Spontanous Breathing and Patient connected to supplemental oxygen  Post-op Assessment: Post-op Vital signs reviewed, Patient's Cardiovascular Status Stable, Respiratory Function Stable, Patent Airway and No signs of Nausea or vomiting  Post-op Vital Signs: Reviewed and stable  Complications: No apparent anesthesia complications

## 2015-08-30 NOTE — H&P (Signed)
  Erin Miniumarren Welden Hausmann, MD [facility] [address] [place], [place] [address] Phone: [phone] Fax : [phone]  Primary Care Physician:  Lenora BoysFRIED, ROBERT L, MD Primary Gastroenterologist:  Dr. Servando SnareWohl  Pre-Procedure History & Physical: HPI:  Erin GlaserCarol K Lucero is a 55 y.o. female is here for an endoscopy.   Past Medical History  Diagnosis Date  . Arthritis     right great toe  . Wears contact lenses   . GERD (gastroesophageal reflux disease)     Past Surgical History  Procedure Laterality Date  . Breast enhancement surgery  2005  . Colonoscopy    . Shoulder arthroscopy with subacromial decompression, rotator cuff repair and bicep tendon repair  02/02/2012    Procedure: SHOULDER ARTHROSCOPY WITH SUBACROMIAL DECOMPRESSION, ROTATOR CUFF REPAIR AND BICEP TENDON REPAIR;  Surgeon: Mable ParisJustin William Chandler, MD;  Location: [facility];  Service: Orthopedics;  Laterality: Left;  Possible Bicep Tendondesis, open tenodesis    Prior to Admission medications   Medication Sig Start Date End Date Taking? Authorizing Provider  buPROPion (WELLBUTRIN XL) 150 MG 24 hr tablet Take 1 or 2 tablets qd 07/10/15  Yes Faythe GheeSusan W Fisher, PA-C  fluticasone [facility](FLONASE) 50 MCG/ACT nasal spray Place into both nostrils daily.   Yes Historical Provider, MD  loratadine (CLARITIN) 10 MG tablet Take 10 mg by mouth daily.   Yes Historical Provider, MD  pantoprazole (PROTONIX) 40 MG tablet Take 40 mg by mouth daily.   Yes Historical Provider, MD  valACYclovir (VALTREX) 500 MG tablet TAKE 1 TABLET EVERY 24 HRS ORALLY 30 06/24/15  Yes Historical Provider, MD  zolpidem (AMBIEN) 5 MG tablet Take 5 mg by mouth at bedtime as needed for sleep.   Yes Historical Provider, MD    Allergies as of 08/24/2015  . (No Known Allergies)    History reviewed. No pertinent family history.  Social History   Social History  . Marital Status: Married    Spouse Name: N/A  . Number of Children: N/A  . Years of Education: N/A    Occupational History  . Not on file.   Social History Main Topics  . Smoking status: Never Smoker   . Smokeless tobacco: Not on file  . Alcohol Use: 0.6 oz/week    1 Glasses of wine per week     Comment:    . Drug Use: No  . Sexual Activity: Not on file     Comment: only smoked some in college   Other Topics Concern  . Not on file   Social History Narrative    Review of Systems: See HPI, otherwise negative ROS  Physical Exam: BP 137/93 mmHg  Pulse 72  Temp(Src) 97.7 F (36.5 C) (Tympanic)  Resp 16  Ht 5\' 8"  (1.727 m)  Wt 148 lb (67.132 kg)  BMI 22.51 kg/m2  SpO2 100% General:   Alert,  pleasant and cooperative in NAD Head:  Normocephalic and atraumatic. Neck:  Supple; no masses or thyromegaly. Lungs:  Clear throughout to auscultation.    Heart:  Regular rate and rhythm. Abdomen:  Soft, nontender and nondistended. Normal bowel sounds, without guarding, and without rebound.   Neurologic:  Alert and  oriented x4;  grossly normal neurologically.  Impression/Plan: Erin Lucero is here for an endoscopy to be performed for gerd  Risks, benefits, limitations, and alternatives regarding  endoscopy have been reviewed with the patient.  Questions have been answered.  All parties agreeable.   Erin Miniumarren Edrees Valent, MD  08/30/2015, 10:45 AM

## 2015-08-30 NOTE — Anesthesia Procedure Notes (Signed)
Procedure Name: MAC Performed by: Cheron Pasquarelli Pre-anesthesia Checklist: Patient identified, Emergency Drugs available, Suction available, Timeout performed and Patient being monitored Patient Re-evaluated:Patient Re-evaluated prior to inductionOxygen Delivery Method: Nasal cannula Placement Confirmation: positive ETCO2       

## 2015-08-30 NOTE — Op Note (Signed)
[facility] Gastroenterology Patient Name: Erin RandCarol Lucero Procedure Date: 08/30/2015 11:12 AM MRN: [id number] Account #: [id number] Date of Birth: [birth year] Admit Type: Outpatient Age: 55 [facility] Gender: Female Note Status: Finalized Procedure:            Upper GI endoscopy Indications:          Dysphagia, Globus sensation Providers:            Midge Miniumarren Keidra Withers, MD Referring MD:         Marinda Elkobert Fried (Referring MD) Medicines:            Propofol per Anesthesia Complications:        No immediate complications. Procedure:            Pre-Anesthesia Assessment:                       - Prior to the procedure, a History and Physical was                        performed, and patient medications and allergies were                        reviewed. The patient's tolerance of previous                        anesthesia was also reviewed. The risks and benefits of                        the procedure and the sedation options and risks were                        discussed with the patient. All questions were                        answered, and informed consent was obtained. Prior                        Anticoagulants: The patient has taken no previous                        anticoagulant or antiplatelet agents. ASA Grade                        Assessment: II - A patient with mild systemic disease.                        After reviewing the risks and benefits, the patient was                        deemed in satisfactory condition to undergo the                        procedure.                       After obtaining informed consent, the endoscope was                        passed under direct vision. Throughout the procedure,  the patient's blood pressure, pulse, and oxygen                        saturations were monitored continuously. The Olympus                        GIF H180J colonscope (U#:[id number](S#:[id number]) was introduced   through the mouth, and advanced to the second part of                        duodenum. The upper GI endoscopy was accomplished                        without difficulty. The patient tolerated the procedure                        well. Findings:      A small hiatal hernia was present.      Localized mild inflammation characterized by erythema was found in the       gastric antrum. Biopsies were taken with a cold forceps for histology.      The examined duodenum was normal.      Two biopsies were obtained with cold forceps for histology in the middle       third of the esophagus. Impression:           - Small hiatal hernia.                       - Gastritis. Biopsied.                       - Normal examined duodenum.                       - Biopsy performed in the middle third of the esophagus. Recommendation:       - Await pathology results. Procedure Code(s):    --- Professional ---                       [phone] [id number], Esophagogastroduodenoscopy, flexible, transoral;                        with biopsy, single or multiple Diagnosis Code(s):    --- Professional ---                       R13.10, Dysphagia, unspecified                       F45.8, Other somatoform disorders                       K29.70, Gastritis, unspecified, without bleeding                       K44.9, Diaphragmatic hernia without obstruction or                        gangrene CPT copyright 2016 [employer]. All rights reserved. The codes documented in this report are preliminary and upon coder review may  be revised to meet current compliance requirements. Midge Miniumarren Maryanna Stuber, MD 08/30/2015 11:34:58 AM This report has been signed electronically. Number of Addenda: 0 Note Initiated On:  08/30/2015 11:12 AM Total Procedure Duration: 0 hours 3 minutes 51 seconds       Acadia Montana

## 2015-08-30 NOTE — Anesthesia Postprocedure Evaluation (Signed)
Anesthesia Post Note  Patient: Erin Lucero  Procedure(s) Performed: Procedure(s) (LRB): ESOPHAGOGASTRODUODENOSCOPY (EGD) WITH PROPOFOL (N/A)  Patient location during evaluation: PACU Anesthesia Type: MAC Level of consciousness: awake and alert and oriented Pain management: satisfactory to patient Vital Signs Assessment: post-procedure vital signs reviewed and stable Respiratory status: spontaneous breathing, nonlabored ventilation and respiratory function stable Cardiovascular status: blood pressure returned to baseline and stable Postop Assessment: Adequate PO intake and No signs of nausea or vomiting Anesthetic complications: no    Cherly BeachStella, Ercilia Bettinger J

## 2015-08-30 NOTE — Anesthesia Preprocedure Evaluation (Signed)
Anesthesia Evaluation  Patient identified by MRN, date of birth, ID band  Reviewed: Allergy & Precautions, H&P , NPO status , Patient's Chart, lab work & pertinent test results  Airway Mallampati: II  TM Distance: >3 FB Neck ROM: full    Dental no notable dental hx.    Pulmonary    Pulmonary exam normal        Cardiovascular  Rhythm:regular Rate:Normal     Neuro/Psych    GI/Hepatic GERD  ,  Endo/Other    Renal/GU      Musculoskeletal   Abdominal   Peds  Hematology   Anesthesia Other Findings   Reproductive/Obstetrics                             Anesthesia Physical Anesthesia Plan  ASA: II  Anesthesia Plan: MAC   Post-op Pain Management:    Induction: Intravenous  Airway Management Planned: Mask  Additional Equipment:   Intra-op Plan:   Post-operative Plan:   Informed Consent: I have reviewed the patients History and Physical, chart, labs and discussed the procedure including the risks, benefits and alternatives for the proposed anesthesia with the patient or authorized representative who has indicated his/her understanding and acceptance.     Plan Discussed with: CRNA  Anesthesia Plan Comments:         Anesthesia Quick Evaluation

## 2015-08-31 ENCOUNTER — Encounter: Payer: Self-pay | Admitting: Gastroenterology

## 2015-09-03 LAB — SURGICAL PATHOLOGY

## 2015-09-04 ENCOUNTER — Telehealth: Payer: Self-pay

## 2015-09-04 NOTE — Telephone Encounter (Signed)
-----   Message from Midge Miniumarren Wohl, MD sent at 09/03/2015 11:53 AM EDT ----- Let the patient know that the biopsies of the esophagus and stomach did not show any cause for her symptoms. She should consider following up with ENT.

## 2015-09-04 NOTE — Telephone Encounter (Signed)
Pt notified of EGD results.  

## 2015-09-18 ENCOUNTER — Emergency Department
Admission: EM | Admit: 2015-09-18 | Discharge: 2015-09-18 | Disposition: A | Discharge status: Home / Self Care | Payer: 59 | Attending: Emergency Medicine | Admitting: Emergency Medicine

## 2015-09-18 ENCOUNTER — Emergency Department: Payer: 59

## 2015-09-18 DIAGNOSIS — R002 Palpitations: Secondary | ICD-10-CM | POA: Insufficient documentation

## 2015-09-18 DIAGNOSIS — Z79899 Other long term (current) drug therapy: Secondary | ICD-10-CM | POA: Diagnosis not present

## 2015-09-18 DIAGNOSIS — M19071 Primary osteoarthritis, right ankle and foot: Secondary | ICD-10-CM | POA: Insufficient documentation

## 2015-09-18 HISTORY — DX: Dyskinesia of esophagus: K22.4

## 2015-09-18 LAB — CBC
HEMATOCRIT: 42.1 % (ref 35.0–47.0)
Hemoglobin: 14.3 g/dL (ref 12.0–16.0)
MCH: 31.7 pg (ref 26.0–34.0)
MCHC: 34 g/dL (ref 32.0–36.0)
MCV: 93.3 fL (ref 80.0–100.0)
Platelets: 219 10*3/uL (ref 150–440)
RBC: 4.51 MIL/uL (ref 3.80–5.20)
RDW: 12.9 % (ref 11.5–14.5)
WBC: 5.9 10*3/uL (ref 3.6–11.0)

## 2015-09-18 LAB — MAGNESIUM: Magnesium: 2 mg/dL (ref 1.7–2.4)

## 2015-09-18 LAB — BASIC METABOLIC PANEL
Anion gap: 11 (ref 5–15)
BUN: 19 mg/dL (ref 6–20)
CHLORIDE: 104 mmol/L (ref 101–111)
CO2: 26 mmol/L (ref 22–32)
Calcium: 9.4 mg/dL (ref 8.9–10.3)
Creatinine, Ser: 0.87 mg/dL (ref 0.44–1.00)
GFR calc Af Amer: >60 mL/min (ref >60)
GFR calc non Af Amer: >60 mL/min (ref >60)
Glucose, Bld: 112 mg/dL — ABNORMAL HIGH (ref 65–99)
POTASSIUM: 3.5 mmol/L (ref 3.5–5.1)
SODIUM: 141 mmol/L (ref 135–145)

## 2015-09-18 LAB — TROPONIN I: Troponin I: <0.03 ng/mL (ref <0.03)

## 2015-09-18 MED ORDER — SODIUM CHLORIDE 0.9 % IV BOLUS (SEPSIS)
1000.0000 mL | Freq: Once | INTRAVENOUS | Status: AC
  Administered 2015-09-18: 1000 mL via INTRAVENOUS

## 2015-09-18 NOTE — ED Triage Notes (Signed)
Pt c/o feeling like her heart is racing intermittent this morning with feeling lightheaded.. States she has been under a lot of stress lately and drinking caffeine and thinks it is related..denies any chest pressure or pain at present.Marland Kitchen

## 2015-09-18 NOTE — ED Notes (Signed)
The EKG was completed and exported into the system. 

## 2015-09-18 NOTE — ED Provider Notes (Signed)
[facility] Emergency Department Provider Note  ____________________________________________  Time seen: Approximately 11:15 AM  I have reviewed the triage vital signs and the nursing notes.   HISTORY  Chief Complaint Palpitations    HPI Erin Lucero is a 55 y.o. female reports that she had an episode where she felt very lightheaded today during a business meeting.  She reports that she was up late, she did not go to bed until about 1 in the morning and then got up at 5 in the morning. She felt very tired throughout the day. While the mean this morning, she reports that she stood up and felt like her heart was racing for a few seconds and she felt very lightheaded. She sat down and got better. She then had a couple other episodes very short where her heart was racing, but she felt lightheaded. She did not pass out. Never had any chest pain. 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 She currently feels okay just feels little bit "dehydrated."  No numbness, weakness, nausea, vomiting. No fevers or chills. No trouble moving arms or legs. No facial droop.  Not pregnant. No heavy bleeding. No heavy periods.  Past Medical History:  Diagnosis Date  . Arthritis    right great toe  . Esophageal spasm   . GERD (gastroesophageal reflux disease)   . Wears contact lenses     Patient Active Problem List   Diagnosis Date Noted  . Problems with swallowing and mastication   . Globus hystericus   . Gastritis   . Hiatal hernia     Past Surgical History:  Procedure Laterality Date  . BREAST ENHANCEMENT SURGERY  2005  . COLONOSCOPY    . ESOPHAGOGASTRODUODENOSCOPY (EGD) WITH PROPOFOL N/A 08/30/2015   Procedure: ESOPHAGOGASTRODUODENOSCOPY (EGD) WITH PROPOFOL;  Surgeon: Midge Minium, MD;  Location: [facility];  Service: Endoscopy;  Laterality: N/A;  . SHOULDER ARTHROSCOPY WITH SUBACROMIAL DECOMPRESSION, ROTATOR CUFF  REPAIR AND BICEP TENDON REPAIR  02/02/2012   Procedure: SHOULDER ARTHROSCOPY WITH SUBACROMIAL DECOMPRESSION, ROTATOR CUFF REPAIR AND BICEP TENDON REPAIR;  Surgeon: Mable Paris, MD;  Location: [facility];  Service: Orthopedics;  Laterality: Left;  Possible Bicep Tendondesis, open tenodesis    Current Outpatient Rx  . Order #: [id number] Class: Normal  . Order #: [id number] Class: Historical Med  . Order #: [id number] Class: Historical Med  . Order #: [id number] Class: Historical Med  . Order #: [id number] Class: Historical Med  . Order #: [id number] Class: Historical Med    Allergies Review of patient's allergies indicates no known allergies.  No family history on file.  Social History Social History  Substance Use Topics  . Smoking status: Never Smoker  . Smokeless tobacco: Never Used  . Alcohol use 0.6 oz/week    1 Glasses of wine per week     Comment:      Review of Systems Constitutional: No fever/chills Eyes: No visual changes. ENT: No sore throat. Cardiovascular: Denies chest pain.See history of present illness Respiratory: Denies shortness of breath. Gastrointestinal: No abdominal pain.  No nausea, no vomiting.  No diarrhea.  No constipation. Genitourinary: Negative for dysuria. Musculoskeletal: Negative for back pain. Skin: Negative for rash. Neurological: Negative for headaches, focal weakness or numbness.  10-point ROS otherwise negative.  ____________________________________________   PHYSICAL EXAM:  VITAL SIGNS: ED Triage Vitals [09/18/15 1104]  Enc Vitals Group     BP      Pulse Rate 86  Resp 17     Temp      Temp Source Oral     SpO2      Weight 145 lb (65.8 kg)     Height  (1.727 m)     Head Circumference      Peak Flow      Pain Score      Pain Loc      Pain Edu?      Excl. in GC?    Constitutional: Alert and oriented. Well appearing and in no acute distress. Eyes: Conjunctivae are normal. PERRL. EOMI. Head:  Atraumatic. Nose: No congestion/rhinnorhea. Mouth/Throat: Mucous membranes are Slightly dry.  Oropharynx non-erythematous. Neck: No stridor.   Cardiovascular: Normal rate, regular rhythm. Grossly normal heart sounds.  Good peripheral circulation. Respiratory: Normal respiratory effort.  No retractions. Lungs CTAB. Gastrointestinal: Soft and nontender. No distention. Musculoskeletal: No lower extremity tenderness nor edema.  No joint effusions. Neurologic:  Normal speech and language. No gross focal neurologic deficits are appreciated. Skin:  Skin is warm, dry and intact. No rash noted. Psychiatric: Mood and affect are normal. Speech and behavior are normal.  ____________________________________________   LABS (all labs ordered are listed, but only abnormal results are displayed)  Labs Reviewed  BASIC METABOLIC PANEL - Abnormal; Notable for the following:       Result Value   Glucose, Bld 112 (*)    All other components within normal limits  CBC  TROPONIN I  MAGNESIUM   ____________________________________________  EKG  ED ECG REPORT I, Jaymien Landin, the attending physician, personally viewed and interpreted this ECG.  Date: 09/18/2015 EKG Time: 1105 Rate: 90 Rhythm: normal sinus rhythm QRS Axis: normal Intervals: normal ST/T Wave abnormalities: normal Conduction Disturbances: none Narrative Interpretation: unremarkable, borderline prolonged QT at 480  ____________________________________________  RADIOLOGY  CLINICAL DATA:  Cardiac palpitations with dizziness EXAM: CHEST  2 VIEW COMPARISON:  None. FINDINGS: Lungs are clear. Heart size and pulmonary vascularity are normal. No adenopathy. No bone lesions. IMPRESSION: No edema or consolidation. Electronically Signed   By: Bretta Bang III M.D.   On: 09/18/2015 11:41 ____________________________________________   PROCEDURES  Procedure(s) performed: None  Critical Care performed:  No  ____________________________________________   INITIAL IMPRESSION / ASSESSMENT AND PLAN / ED COURSE  Pertinent labs & imaging results that were available during my care of the patient were reviewed by me and considered in my medical decision making (see chart for details).    Patient presents for evaluation of lightheadedness associated with brief palpitations occurring this morning with standing. Her symptoms seemed to resolve though she just has a generalized lightheaded feeling. Completely neurologically intact, no strokelike symptoms. She denies any chest pain, does not exhibit any acute cardiac or pulmonary abnormality by exam or clinical history aside from brief episode now resolved palpitation.  Given the patient's history, suspicious that perhaps lack of sleep may be in congestive eating, also reports she had 2 caffeinated beverages today with no lunch. She does not carry a history of cardiac or pulmonary disease. She has a very reassuring clinical examination at this time with no evidence of arrhythmia. Her symptoms do not seem to be concerning and the fact that she did not pass out, or become extremely weak, that she did have to sit down. Her symptoms did last less than a minute and relieved by sitting down, however she does feel generalized sense of lightheadedness now.   EKG is reassuring, though her QTC is borderline prolonged.  Clinical Course  Comment By Time  Abdomen document a 12-lead from the backboard Sharyn Creamer, MD 07/25 1241   ____________________________________________  ----------------------------------------- 1:11 PM on 09/18/2015 -----------------------------------------  Patient ambulatory, reports feeling much improved. Discussed with her and she will be planning to follow up with her friend Dr. Clide Cliff of electrophysiology. Return precautions and treatment recommendations and follow-up discussed with the patient who is agreeable with the plan.  FINAL  CLINICAL IMPRESSION(S) / ED DIAGNOSES  Final diagnoses:  Palpitations      Sharyn Creamer, MD 09/18/15 1312

## 2015-10-10 DIAGNOSIS — F411 Generalized anxiety disorder: Secondary | ICD-10-CM | POA: Diagnosis not present

## 2015-10-10 DIAGNOSIS — R6889 Other general symptoms and signs: Secondary | ICD-10-CM | POA: Diagnosis not present

## 2015-11-15 DIAGNOSIS — H40011 Open angle with borderline findings, low risk, right eye: Secondary | ICD-10-CM | POA: Diagnosis not present

## 2015-11-15 DIAGNOSIS — H43393 Other vitreous opacities, bilateral: Secondary | ICD-10-CM | POA: Diagnosis not present

## 2015-11-22 DIAGNOSIS — G4700 Insomnia, unspecified: Secondary | ICD-10-CM | POA: Diagnosis not present

## 2015-11-22 DIAGNOSIS — F411 Generalized anxiety disorder: Secondary | ICD-10-CM | POA: Diagnosis not present

## 2016-02-21 ENCOUNTER — Other Ambulatory Visit (HOSPITAL_COMMUNITY)
Admission: RE | Admit: 2016-02-21 | Discharge: 2016-02-21 | Disposition: A | Discharge status: Home / Self Care | Payer: 59 | Source: Ambulatory Visit | Attending: Nurse Practitioner | Admitting: Nurse Practitioner

## 2016-02-21 ENCOUNTER — Other Ambulatory Visit: Payer: Self-pay | Admitting: Nurse Practitioner

## 2016-02-21 DIAGNOSIS — F411 Generalized anxiety disorder: Secondary | ICD-10-CM | POA: Diagnosis not present

## 2016-02-21 DIAGNOSIS — Z01419 Encounter for gynecological examination (general) (routine) without abnormal findings: Secondary | ICD-10-CM | POA: Diagnosis not present

## 2016-02-21 DIAGNOSIS — Z1151 Encounter for screening for human papillomavirus (HPV): Secondary | ICD-10-CM | POA: Insufficient documentation

## 2016-02-26 ENCOUNTER — Encounter: Payer: Self-pay | Admitting: Physician Assistant

## 2016-02-26 ENCOUNTER — Ambulatory Visit: Payer: Self-pay | Admitting: Physician Assistant

## 2016-02-26 VITALS — BP 120/82 | HR 73 | Temp 97.8°F

## 2016-02-26 DIAGNOSIS — J0100 Acute maxillary sinusitis, unspecified: Secondary | ICD-10-CM

## 2016-02-26 MED ORDER — AZITHROMYCIN 250 MG PO TABS: ORAL_TABLET | ORAL | 0 refills | Status: DC

## 2016-02-26 NOTE — Progress Notes (Signed)
S: C/o sinus drainage and congestion for 7 days, no fever, chills, cp/sob, v/d; mucus is green and thick, cough is sporadic, c/o of facial and dental pain. Is snoring and feels like she can't breathe through her nose  Using otc meds:   O: PE: vitals wnl, nad, perrl eomi, normocephalic, tms dull, nasal mucosa red and swollen, throat injected, neck supple no lymph, lungs c t a, cv rrr, neuro intact  A:  Acute sinusitis   P: drink fluids, continue regular meds , use otc meds of choice, return if not improving in 5 days, return earlier if worsening , zpack, flonase

## 2016-02-27 LAB — CYTOLOGY - PAP
Diagnosis: NEGATIVE
HPV: NOT DETECTED

## 2016-03-14 DIAGNOSIS — G4700 Insomnia, unspecified: Secondary | ICD-10-CM | POA: Diagnosis not present

## 2016-03-14 DIAGNOSIS — F411 Generalized anxiety disorder: Secondary | ICD-10-CM | POA: Diagnosis not present

## 2016-03-20 ENCOUNTER — Encounter: Payer: Self-pay | Admitting: Physician Assistant

## 2016-03-20 ENCOUNTER — Ambulatory Visit: Payer: Self-pay | Admitting: Physician Assistant

## 2016-03-20 VITALS — BP 120/80 | HR 86 | Temp 98.8°F

## 2016-03-20 DIAGNOSIS — R0602 Shortness of breath: Secondary | ICD-10-CM

## 2016-03-20 MED ORDER — METHYLPREDNISOLONE 4 MG PO TBPK: ORAL_TABLET | ORAL | 0 refills | Status: DC

## 2016-03-20 MED ORDER — PSEUDOEPH-BROMPHEN-DM 30-2-10 MG/5ML PO SYRP: 5.0000 mL | ORAL_SOLUTION | Freq: Four times a day (QID) | ORAL | 0 refills | Status: DC | PRN

## 2016-03-20 MED ORDER — IPRATROPIUM-ALBUTEROL 0.5-2.5 (3) MG/3ML IN SOLN
3.0000 mL | Freq: Four times a day (QID) | RESPIRATORY_TRACT | Status: AC
  Administered 2016-03-20: 3 mL via RESPIRATORY_TRACT

## 2016-03-20 NOTE — Progress Notes (Signed)
   Subjective:cough    Patient ID: Erin GlaserCarol K Edsall, female    DOB: 1960/03/20, 56 y.o.   MRN: [id number]  HPI Patient c/o non-productive cough onset overnight. Dx/Tx for URI with Zithromax 2 weeks ago. Denies fever/chill, or N/V/D. Taken Flu shot this season.   Review of Systems Negative except for complaint.    Objective:   Physical Exam No acute distress. Continual non-productive cough. Neck supple, Lungs with Rales. Heart RRR.       Assessment & Plan: Bronchospasm  S/P Duo neb patient given  Bromfed DM and Medrol dose pack. Follow up 3 days if no improvement.

## 2016-03-28 DIAGNOSIS — F411 Generalized anxiety disorder: Secondary | ICD-10-CM | POA: Diagnosis not present

## 2016-03-28 DIAGNOSIS — G4700 Insomnia, unspecified: Secondary | ICD-10-CM | POA: Diagnosis not present

## 2016-04-04 ENCOUNTER — Other Ambulatory Visit: Payer: Self-pay | Admitting: Family Medicine

## 2016-04-04 DIAGNOSIS — Z1231 Encounter for screening mammogram for malignant neoplasm of breast: Secondary | ICD-10-CM

## 2016-04-11 DIAGNOSIS — F411 Generalized anxiety disorder: Secondary | ICD-10-CM | POA: Diagnosis not present

## 2016-04-11 DIAGNOSIS — G4700 Insomnia, unspecified: Secondary | ICD-10-CM | POA: Diagnosis not present

## 2016-04-30 DIAGNOSIS — M25511 Pain in right shoulder: Secondary | ICD-10-CM | POA: Diagnosis not present

## 2016-05-01 ENCOUNTER — Ambulatory Visit: Payer: 59 | Attending: Orthopedic Surgery

## 2016-05-01 DIAGNOSIS — G4700 Insomnia, unspecified: Secondary | ICD-10-CM | POA: Diagnosis not present

## 2016-05-01 DIAGNOSIS — M25611 Stiffness of right shoulder, not elsewhere classified: Secondary | ICD-10-CM | POA: Diagnosis not present

## 2016-05-01 DIAGNOSIS — M25511 Pain in right shoulder: Secondary | ICD-10-CM | POA: Diagnosis not present

## 2016-05-01 DIAGNOSIS — M6281 Muscle weakness (generalized): Secondary | ICD-10-CM | POA: Insufficient documentation

## 2016-05-01 DIAGNOSIS — F411 Generalized anxiety disorder: Secondary | ICD-10-CM | POA: Diagnosis not present

## 2016-05-01 NOTE — Therapy (Signed)
[place] [facility] MAIN [facility] SERVICES [address] [place], [place], [address] Phone: [phone]   Fax:  [phone]  Physical Therapy Treatment  Patient Details  Name: Erin Lucero MRN: [id number] Date of Birth: 31-Jul-1960 Referring Provider: Jones Broom MD  Encounter Date: 05/01/2016      PT End of Session - 05/01/16 1305    Visit Number 1   Number of Visits 12   PT Start Time 0930   PT Stop Time 1030   PT Time Calculation (min) 60 min   Activity Tolerance Patient tolerated treatment well   Behavior During Therapy [facility] for tasks assessed/performed      Past Medical History:  Diagnosis Date  . Arthritis    right great toe  . Esophageal spasm   . GERD (gastroesophageal reflux disease)   . Wears contact lenses     Past Surgical History:  Procedure Laterality Date  . BREAST ENHANCEMENT SURGERY  2005  . COLONOSCOPY    . ESOPHAGOGASTRODUODENOSCOPY (EGD) WITH PROPOFOL N/A 08/30/2015   Procedure: ESOPHAGOGASTRODUODENOSCOPY (EGD) WITH PROPOFOL;  Surgeon: Midge Minium, MD;  Location: [facility];  Service: Endoscopy;  Laterality: N/A;  . SHOULDER ARTHROSCOPY WITH SUBACROMIAL DECOMPRESSION, ROTATOR CUFF REPAIR AND BICEP TENDON REPAIR  02/02/2012   Procedure: SHOULDER ARTHROSCOPY WITH SUBACROMIAL DECOMPRESSION, ROTATOR CUFF REPAIR AND BICEP TENDON REPAIR;  Surgeon: Mable Paris, MD;  Location: [facility];  Service: Orthopedics;  Laterality: Left;  Possible Bicep Tendondesis, open tenodesis    There were no vitals filed for this visit.      Subjective Assessment - 05/01/16 1255    Subjective Patient states she has increased right shoulder pain from insidious onset starting in the beginning of this year. Patient reports increased pain with performance of shoulder ER and raising her arm overhead. Patient reports difficulty with performing recreational activities such as aerobics and exercises. Patient reports she  experiences a light ache after performing exercises such as planks and weight bearing exercises through her R UE. Patient states her pain at best is a 0/10 and a 4/10 at worst. Patient states the pain does not wake her up at night and is able to raise her arm overhead with minimal pain    Pertinent History Previous RCR on L Shoulder   Limitations Lifting   Diagnostic tests Ultrasound - positive for enlarged bursa by pt report   Patient Stated Goals Would like to be able to move arm normally again.    Currently in Pain? Yes  When raising arm overhead   Pain Score 4    Pain Location Shoulder   Pain Orientation Right   Pain Descriptors / Indicators Sharp;Aching   Pain Onset More than a month ago   Pain Frequency Intermittent            [facility] PT Assessment - 05/01/16 0943      Assessment   Medical Diagnosis R shoulder pain   Referring Provider Jones Broom MD   Onset Date/Surgical Date 02/25/16   Hand Dominance Right   Next MD Visit unknown   Prior Therapy none     Balance Screen   Has the patient fallen in the past 6 months No   Has the patient had a decrease in activity level because of a fear of falling?  Yes   Is the patient reluctant to leave their home because of a fear of falling?  No     Prior Function   Level of Independence  Independent   Vocation Full time employment   Vocation Requirements opening doors, computer work, assisting patients from sitting to standing   Leisure cooking, outdoors activies     Cognition   Overall Cognitive Status Within Functional Limits for tasks assessed     ROM / Strength   AROM / PROM / Strength AROM;Strength     AROM   AROM Assessment Site Shoulder;Elbow   Right/Left Shoulder Right;Left   Right Shoulder Flexion 170 Degrees   Right Shoulder ABduction 100 Degrees   Right Shoulder Internal Rotation 80 Degrees   Right Shoulder External Rotation 40 Degrees   Left Shoulder Flexion 180 Degrees   Left Shoulder ABduction 180  Degrees   Left Shoulder Internal Rotation 80 Degrees   Left Shoulder External Rotation 80 Degrees   Right/Left Elbow Right;Left   Right Elbow Flexion 130   Right Elbow Extension 0   Left Elbow Flexion 130   Left Elbow Extension 0     Strength   Strength Assessment Site Elbow;Shoulder   Right/Left Shoulder Right;Left   Right Shoulder Flexion 3/5   Right Shoulder ABduction 2-/5   Right Shoulder Internal Rotation 4+/5   Right Shoulder External Rotation 3+/5   Left Shoulder Flexion 5/5   Left Shoulder ABduction 5/5   Left Shoulder Internal Rotation 5/5   Left Shoulder External Rotation 5/5   Right/Left Elbow Right;Left   Right Elbow Flexion 5/5   Right Elbow Extension 5/5   Left Elbow Flexion 5/5   Left Elbow Extension 5/5     Palpation   Palpation comment Significant increase in pain along proximal biceps tendon/pec major junction. TTP along supraspinatus, infraspinatus, and teres minor musculature.      Special Tests    Special Tests Biceps/Labral Tests   Biceps/Labral tests Other     other   Findings Negative   Side Right   Comment Biceps load test:       Observation: Posture: Patient sits in erect posture with slight FHP and forward rounded shoulders Outcome Measures: QuickDASH: 48%; Sports QuickDASH: 50% TREATMENT: Bilateral AAROM shoulder flexion in sitting against gravity - x10 Shoulder abduction on the R side only - x 5 Scapular retraction in sitting - x 20        PT Education - 05/01/16 1301    Education provided Yes   Education Details POC; HEP: Scapular retraction, Shoulder abduction in sitting, shoulder flexion AAROM   Person(s) Educated Patient   Methods Explanation;Demonstration   Comprehension Verbalized understanding;Returned demonstration             PT Long Term Goals - 05/01/16 1354      PT LONG TERM GOAL #1   Title Patient will improve QuickDASH to <10% to indicate significant improvement in shoulder function and improvement     Baseline QuickDASH: 48%   Time 6   Period Weeks   Status New     PT LONG TERM GOAL #2   Title Patient will be able to move shoulder throughout full AROM  for shoulder ER, abduction and flexion in the R UE without pain to demonstrate greater ability to perform recreational activities   Baseline Shoulder abd: 110, Shoulder ER: 40, shoulder flexion 170   Time 6   Period Weeks   Status New     PT LONG TERM GOAL #3   Title Patient will be independent with HEP to continue benefits from therapy after discharge from physical therapy   Baseline Patient dependent for exercise form/technique  Time 6   Period Weeks   Status New               Plan - 05/01/16 1335    Clinical Impression Statement Pt is a 56 yo right hand dominant female who presents with R shoulder pain/dysfunction from insidious onset starting in the beginning of January 2018. Patient's increased shoulder dysfunction is indicated by impaired and painful Shoulder AROM (In flexion and ER), impaired QuickDASH score, and decreased shoulder strength and coordination. Patient will benefit from further skilled therapy to improve shoulder function and return to prior level of function.    Rehab Potential Good   Clinical Impairments Affecting Rehab Potential (+)age, low irratibility (-): Chronicity of injury   PT Frequency 2x / week   PT Duration 6 weeks   PT Treatment/Interventions Therapeutic exercise;Iontophoresis 4mg /ml Dexamethasone;ADLs/Self Care Home Management;Electrical Stimulation;Moist Heat;Ultrasound;Cryotherapy;Patient/family education;Neuromuscular re-education;Manual techniques;Passive range of motion;Dry needling   PT Next Visit Plan Progress mobility and strengthening exercises   PT Home Exercise Plan Scapular retractions, AAROM shoulder flexion, Shoulder Abduction   Consulted and Agree with Plan of Care Patient      Patient will benefit from skilled therapeutic intervention in order to improve the following  deficits and impairments:  Impaired flexibility, Impaired UE functional use, Decreased strength, Decreased endurance, Decreased range of motion, Decreased activity tolerance, Decreased coordination, Decreased mobility, Increased muscle spasms, Pain, Increased fascial restricitons  Visit Diagnosis: Right shoulder pain, unspecified chronicity  Stiffness of right shoulder, not elsewhere classified  Muscle weakness (generalized)     Problem List Patient Active Problem List   Diagnosis Date Noted  . Problems with swallowing and mastication   . Globus hystericus   . Gastritis   . Hiatal hernia     Myrene Galas, PT DPT 05/01/2016, 5:43 PM  [place] [facility] MAIN [facility] SERVICES [address] [place], [place], [address] Phone: [phone]   Fax:  [phone]  Name: Erin Lucero MRN: [id number] Date of Birth: March 01, 1960

## 2016-05-01 NOTE — Addendum Note (Signed)
Addended by: Bethanie DickerISSELL, [place] V on: 05/01/2016 05:48 PM   Modules accepted: Orders

## 2016-05-06 ENCOUNTER — Ambulatory Visit: Payer: Self-pay

## 2016-05-08 ENCOUNTER — Ambulatory Visit: Payer: 59

## 2016-05-08 DIAGNOSIS — M6281 Muscle weakness (generalized): Secondary | ICD-10-CM | POA: Diagnosis not present

## 2016-05-08 DIAGNOSIS — M25611 Stiffness of right shoulder, not elsewhere classified: Secondary | ICD-10-CM

## 2016-05-08 DIAGNOSIS — M25511 Pain in right shoulder: Secondary | ICD-10-CM

## 2016-05-08 NOTE — Therapy (Signed)
[facility] MAIN [facility] [address] [place], [place], [address] Phone: [phone]   Fax:  [phone]  Physical Therapy Treatment  Patient Details  Name: Erin Lucero MRN: [id number] Date of Birth: [birth year] Referring Provider: Jones BroomJustin Chandler MD  Encounter Date: 05/08/2016      PT End of Session - 05/08/16 [id number]    Visit Number 2   Number of Visits 12   PT Start Time 0845   PT Stop Time 0930   PT Time Calculation (min) 45 min   Activity Tolerance Patient tolerated treatment well   Behavior [facility] for tasks assessed/performed      Past Medical History:  Diagnosis Date  . Arthritis    right great toe  . Esophageal spasm   . GERD (gastroesophageal reflux disease)   . Wears contact lenses     Past Surgical History:  Procedure Laterality Date  . BREAST ENHANCEMENT SURGERY  2005  . COLONOSCOPY    . ESOPHAGOGASTRODUODENOSCOPY (EGD) WITH PROPOFOL N/A 08/30/2015   Procedure: ESOPHAGOGASTRODUODENOSCOPY (EGD) WITH PROPOFOL;  Surgeon: Midge Miniumarren Wohl, MD;  [facility];  Service: Endoscopy;  Laterality: N/A;  . SHOULDER ARTHROSCOPY WITH SUBACROMIAL DECOMPRESSION, ROTATOR CUFF REPAIR AND BICEP TENDON REPAIR  02/02/2012   Procedure: SHOULDER ARTHROSCOPY WITH SUBACROMIAL DECOMPRESSION, ROTATOR CUFF REPAIR AND BICEP TENDON REPAIR;  Surgeon: Mable ParisJustin William Chandler, MD;  Location: [facility];  Service: Orthopedics;  Laterality: Left;  Possible Bicep Tendondesis, open tenodesis    There were no vitals filed for this visit.      Subjective Assessment - 05/08/16 0920    Subjective Patient reports increased shoulder pain in the fron t of the hsoulder but is able to bring her up higher overhead then the previous session.    Pertinent History Previous RCR on L Shoulder   Limitations Lifting   Diagnostic tests Ultrasound - positive for enlarged bursa by pt report   Patient Stated Goals Would like to  be able to move arm normally again.    Currently in Pain? Yes   Pain Score 2    Pain Location Shoulder   Pain Orientation Right   Pain Descriptors / Indicators Aching   Pain Onset More than a month ago       TREATMENT Manual therapy: STM utilizing superficial and light deep techniques to decrease pain and spasms to the biceps tendon and pec major tendon with patient positioned in supine. Performed STM to Upper traps on the R side with patient in sitting to decrease pain and spasms.  Superior->Inferior III/IV with patient in supine - 3 x 40sec; Ant -> Post glides grade III/IV - 3 x 40sec on R side.   Therapeutic Exercise Straight-arm push downs at MATRIX- 20 # x20 Low row at MATRIX - 12.5# x20 Standing retraction at MATRIX - 17.5# x20 High Row at [facility] in sitting - 17.5# x20  Standing abduction - x 10 throughout full AROM Supine Abduction - x10 throughout full AROM *Patient performed shoulder exercises at MATRIX throughout decreased AROM to not aggravate pain       PT Education - 05/08/16 0921    Education provided Yes   Education Details Form/technique throughout session   Person(s) Educated Patient   Methods Explanation;Demonstration   Comprehension Verbalized understanding;Returned demonstration             PT Long Term Goals - 05/01/16 1354      PT LONG TERM GOAL #1   Title  Patient will improve QuickDASH to <10% to indicate significant improvement in shoulder function and improvement    Baseline QuickDASH: 48%   Time 6   Period Weeks   Status New     PT LONG TERM GOAL #2   Title Patient will be able to move shoulder throughout full AROM  for shoulder ER, abduction and flexion in the R UE without pain to demonstrate greater ability to perform recreational activities   Baseline Shoulder abd: 110, Shoulder ER: 40, shoulder flexion 170   Time 6   Period Weeks   Status New     PT LONG TERM GOAL #3   Title Patient will be independent with HEP to continue  benefits from therapy after discharge from physical therapy   Baseline Patient dependent for exercise form/technique   Time 6   Period Weeks   Status New               Plan - 05/08/16 1610    Clinical Impression Statement Pt demonstrates improved overhead shoulder abduction after performance of manual therapy and mobility exercises. Patient able to perform full shoulder abduction  with minimal pain between 70-90 degrees. Patient demonstrates decreased muscular endurance/coordination/strength and will benefit from further skilled therapy to return to prior level of function.    Rehab Potential Good   Clinical Impairments Affecting Rehab Potential (+)age, low irratibility (-): Chronicity of injury   PT Frequency 2x / week   PT Duration 6 weeks   PT Treatment/Interventions Therapeutic exercise;Iontophoresis 4mg /ml Dexamethasone;ADLs/Self Care Home Management;Electrical Stimulation;Moist Heat;Ultrasound;Cryotherapy;Patient/family education;Neuromuscular re-education;Manual techniques;Passive range of motion;Dry needling   PT Next Visit Plan Progress mobility and strengthening exercises   PT Home Exercise Plan Scapular retractions, AAROM shoulder flexion, Shoulder Abduction   Consulted and Agree with Plan of Care Patient      Patient will benefit from skilled therapeutic intervention in order to improve the following deficits and impairments:  Impaired flexibility, Impaired UE functional use, Decreased strength, Decreased endurance, Decreased range of motion, Decreased activity tolerance, Decreased coordination, Decreased mobility, Increased muscle spasms, Pain, Increased fascial restricitons  Visit Diagnosis: Stiffness of right shoulder, not elsewhere classified  Right shoulder pain, unspecified chronicity  Muscle weakness (generalized)     Problem List Patient Active Problem List   Diagnosis Date Noted  . Problems with swallowing and mastication   . Globus hystericus   .  Gastritis   . Hiatal hernia     Myrene Galas, PT DPT 05/08/2016, 9:33 AM  [place] [facility] MAIN [facility] SERVICES [address] [place], [place], [address] Phone: [phone]   Fax:  [phone]  Name: Erin Lucero MRN: [id number] Date of Birth: November 01, 1960

## 2016-05-12 ENCOUNTER — Ambulatory Visit: Payer: 59

## 2016-05-15 ENCOUNTER — Ambulatory Visit: Payer: 59

## 2016-05-15 DIAGNOSIS — M6281 Muscle weakness (generalized): Secondary | ICD-10-CM

## 2016-05-15 DIAGNOSIS — M25511 Pain in right shoulder: Secondary | ICD-10-CM | POA: Diagnosis not present

## 2016-05-15 DIAGNOSIS — M25611 Stiffness of right shoulder, not elsewhere classified: Secondary | ICD-10-CM

## 2016-05-15 NOTE — Therapy (Signed)
[place] [facility] MAIN [facility] SERVICES [address] [place], [place], [address] Phone: [phone]   Fax:  [phone]  Physical Therapy Treatment  Patient Details  Name: Erin Lucero MRN: [id number] Date of Birth: 05/11/1960 Referring Provider: Jones Broom MD  Encounter Date: 05/15/2016      PT End of Session - 05/15/16 1329    Visit Number 3   Number of Visits 12   PT Start Time 1300   PT Stop Time 1345   PT Time Calculation (min) 45 min   Activity Tolerance Patient tolerated treatment well   Behavior During Therapy [facility] for tasks assessed/performed      Past Medical History:  Diagnosis Date  . Arthritis    right great toe  . Esophageal spasm   . GERD (gastroesophageal reflux disease)   . Wears contact lenses     Past Surgical History:  Procedure Laterality Date  . BREAST ENHANCEMENT SURGERY  2005  . COLONOSCOPY    . ESOPHAGOGASTRODUODENOSCOPY (EGD) WITH PROPOFOL N/A 08/30/2015   Procedure: ESOPHAGOGASTRODUODENOSCOPY (EGD) WITH PROPOFOL;  Surgeon: Midge Minium, MD;  Location: [facility];  Service: Endoscopy;  Laterality: N/A;  . SHOULDER ARTHROSCOPY WITH SUBACROMIAL DECOMPRESSION, ROTATOR CUFF REPAIR AND BICEP TENDON REPAIR  02/02/2012   Procedure: SHOULDER ARTHROSCOPY WITH SUBACROMIAL DECOMPRESSION, ROTATOR CUFF REPAIR AND BICEP TENDON REPAIR;  Surgeon: Mable Paris, MD;  Location: [facility];  Service: Orthopedics;  Laterality: Left;  Possible Bicep Tendondesis, open tenodesis    There were no vitals filed for this visit.      Subjective Assessment - 05/15/16 1308    Subjective Patient reports increased shoulder pain in the fron t of the hsoulder but is able to bring her up higher overhead then the previous session.    Pertinent History Previous RCR on L Shoulder   Limitations Lifting   Diagnostic tests Ultrasound - positive for enlarged bursa by pt report   Patient Stated Goals Would like to  be able to move arm normally again.    Currently in Pain? No/denies   Pain Score --   Pain Onset More than a month ago      TREATMENT Manual therapy: STM utilizing superficial and light deep techniques to decrease pain and spasms to the biceps tendon and pec major tendon with patient positioned in supine.  Superior->Inferior III/IV with patient in supine - 3 x 40sec; Ant -> Post glides grade III/IV - 3 x 40sec on R side.     Therapeutic Exercise Standing retraction at MATRIX - 15# x20 YW's while facing wall - x 15 stopped due to fatigue Bicep curls on R side - x20 #9  Serratus punches in supine - x 20 # 9 B Manually resisted biceps curl in supine - x 20  *Patient performed shoulder exercises throughout decreased AROM to not aggravate pain       PT Education - 05/15/16 1328    Education provided Yes   Education Details Form/technique with exercises   Person(s) Educated Patient   Methods Explanation;Demonstration   Comprehension Verbalized understanding;Returned demonstration             PT Long Term Goals - 05/01/16 1354      PT LONG TERM GOAL #1   Title Patient will improve QuickDASH to <10% to indicate significant improvement in shoulder function and improvement    Baseline QuickDASH: 48%   Time 6   Period Weeks   Status New  PT LONG TERM GOAL #2   Title Patient will be able to move shoulder throughout full AROM  for shoulder ER, abduction and flexion in the R UE without pain to demonstrate greater ability to perform recreational activities   Baseline Shoulder abd: 110, Shoulder ER: 40, shoulder flexion 170   Time 6   Period Weeks   Status New     PT LONG TERM GOAL #3   Title Patient will be independent with HEP to continue benefits from therapy after discharge from physical therapy   Baseline Patient dependent for exercise form/technique   Time 6   Period Weeks   Status New               Plan - 05/15/16 1337    Clinical Impression Statement  Patient demonstrates improvement in shoulder function demonstrating decreased pain when moving the arm overhead after performing manual therapy. Patient demonstrates increased TTP along brachioradialis and biceps and performed loaded bicep curls and chops to help decrease. Patient will benefit from further skilled therapy focused on improving muscular endurance, coordination, and strength. Patient will benefit from further skilled therapy to return to prior level of function.    Rehab Potential Good   Clinical Impairments Affecting Rehab Potential (+)age, low irratibility (-): Chronicity of injury   PT Frequency 2x / week   PT Duration 6 weeks   PT Treatment/Interventions Therapeutic exercise;Iontophoresis 4mg /ml Dexamethasone;ADLs/Self Care Home Management;Electrical Stimulation;Moist Heat;Ultrasound;Cryotherapy;Patient/family education;Neuromuscular re-education;Manual techniques;Passive range of motion;Dry needling   PT Next Visit Plan Progress mobility and strengthening exercises   PT Home Exercise Plan Scapular retractions, AAROM shoulder flexion, Shoulder Abduction   Consulted and Agree with Plan of Care Patient      Patient will benefit from skilled therapeutic intervention in order to improve the following deficits and impairments:  Impaired flexibility, Impaired UE functional use, Decreased strength, Decreased endurance, Decreased range of motion, Decreased activity tolerance, Decreased coordination, Decreased mobility, Increased muscle spasms, Pain, Increased fascial restricitons  Visit Diagnosis: Stiffness of right shoulder, not elsewhere classified  Right shoulder pain, unspecified chronicity  Muscle weakness (generalized)     Problem List Patient Active Problem List   Diagnosis Date Noted  . Problems with swallowing and mastication   . Globus hystericus   . Gastritis   . Hiatal hernia     Myrene GalasWesley Keileigh Vahey, PT DPT 05/15/2016, 1:54 PM  [place] [facility] [address] [place], [place], [address] Phone: [phone]   Fax:  [phone]  Name: Erin Lucero MRN: [id number] Date of Birth: Jul 09, 1960

## 2016-05-21 ENCOUNTER — Ambulatory Visit: Payer: 59

## 2016-05-21 DIAGNOSIS — M6281 Muscle weakness (generalized): Secondary | ICD-10-CM

## 2016-05-21 DIAGNOSIS — M25611 Stiffness of right shoulder, not elsewhere classified: Secondary | ICD-10-CM

## 2016-05-21 DIAGNOSIS — M25511 Pain in right shoulder: Secondary | ICD-10-CM | POA: Diagnosis not present

## 2016-05-21 NOTE — Therapy (Signed)
[place] [facility] MAIN [facility] SERVICES [address] [place], [place], [address] Phone: [phone]   Fax:  [phone]  Physical Therapy Treatment  Patient Details  Name: Erin Lucero MRN: [id number] Date of Birth: [birth year] Referring Provider: Jones Broom MD  Encounter Date: 05/21/2016      PT End of Session - 05/21/16 1421    Visit Number 4   Number of Visits 12   PT Start Time 1345   PT Stop Time 1430   PT Time Calculation (min) 45 min   Activity Tolerance Patient tolerated treatment well   Behavior During Therapy [facility] for tasks assessed/performed      Past Medical History:  Diagnosis Date  . Arthritis    right great toe  . Esophageal spasm   . GERD (gastroesophageal reflux disease)   . Wears contact lenses     Past Surgical History:  Procedure Laterality Date  . BREAST ENHANCEMENT SURGERY  2005  . COLONOSCOPY    . ESOPHAGOGASTRODUODENOSCOPY (EGD) WITH PROPOFOL N/A 08/30/2015   Procedure: ESOPHAGOGASTRODUODENOSCOPY (EGD) WITH PROPOFOL;  Surgeon: Midge Minium, MD;  Location: [facility];  Service: Endoscopy;  Laterality: N/A;  . SHOULDER ARTHROSCOPY WITH SUBACROMIAL DECOMPRESSION, ROTATOR CUFF REPAIR AND BICEP TENDON REPAIR  02/02/2012   Procedure: SHOULDER ARTHROSCOPY WITH SUBACROMIAL DECOMPRESSION, ROTATOR CUFF REPAIR AND BICEP TENDON REPAIR;  Surgeon: Mable Paris, MD;  Location:  SURGERY CENTER;  Service: Orthopedics;  Laterality: Left;  Possible Bicep Tendondesis, open tenodesis    There were no vitals filed for this visit.      Subjective Assessment - 05/21/16 1353    Subjective Patient reports increased shoulder pain when waking up in the morning and states she's been sleeping on her arm.   Pertinent History Previous RCR on L Shoulder   Limitations Lifting   Diagnostic tests Ultrasound - positive for enlarged bursa by pt report   Patient Stated Goals Would like to be able to move arm normally  again.    Currently in Pain? No/denies   Pain Onset More than a month ago      TREATMENT Manual therapy: STM utilizing superficial and light deep techniques to decrease pain and spasms to the biceps tendon with patient positioned in supine.  Superior->Inferior III/IV with patient in supine - 3 x 30sec; Ant -> Post glides grade III/IV - 3 x 30sec on R side.     Therapeutic Exercise Standing retraction at MATRIX - 22.5# x20 Bicep curls on R side - 3 x 15 #9  Seated high row - 22.5# x 20 Standing downward chops B - 12.5# x 20  Standing upward chops B - 12.5# x 20  Shoulder flexion in supination against YTB - x 20 Y,T, W's while facing wall - x 15 stopped due to fatigue Serratus punches in supine - x 20 # 9 B *Patient performed shoulder exercises throughout decreased AROM to not aggravate pain       PT Education - 05/21/16 1357    Education provided Yes   Education Details form/technique with exercise   Person(s) Educated Patient   Methods Explanation;Demonstration   Comprehension Verbalized understanding;Returned demonstration             PT Long Term Goals - 05/01/16 1354      PT LONG TERM GOAL #1   Title Patient will improve QuickDASH to <10% to indicate significant improvement in shoulder function and improvement    Baseline QuickDASH: 48%   Time 6  Period Weeks   Status New     PT LONG TERM GOAL #2   Title Patient will be able to move shoulder throughout full AROM  for shoulder ER, abduction and flexion in the R UE without pain to demonstrate greater ability to perform recreational activities   Baseline Shoulder abd: 110, Shoulder ER: 40, shoulder flexion 170   Time 6   Period Weeks   Status New     PT LONG TERM GOAL #3   Title Patient will be independent with HEP to continue benefits from therapy after discharge from physical therapy   Baseline Patient dependent for exercise form/technique   Time 6   Period Weeks   Status New               Plan  - 05/21/16 1421    Clinical Impression Statement Patient demosntrates improvement in biceps tendon symtpoms with patient demonstrating less pain upon palpation indicating functional carryover between visits. Patient able to perform overhead exercises without pain indicating furrther carryover but continues to increased pain with weighted arm movement and patient will benefit from further skilled therapy to return to prior level of function.    Rehab Potential Good   Clinical Impairments Affecting Rehab Potential (+)age, low irratibility (-): Chronicity of injury   PT Frequency 2x / week   PT Duration 6 weeks   PT Treatment/Interventions Therapeutic exercise;Iontophoresis 4mg /ml Dexamethasone;ADLs/Self Care Home Management;Electrical Stimulation;Moist Heat;Ultrasound;Cryotherapy;Patient/family education;Neuromuscular re-education;Manual techniques;Passive range of motion;Dry needling   PT Next Visit Plan Progress mobility and strengthening exercises   PT Home Exercise Plan Scapular retractions, AAROM shoulder flexion, Shoulder Abduction   Consulted and Agree with Plan of Care Patient      Patient will benefit from skilled therapeutic intervention in order to improve the following deficits and impairments:  Impaired flexibility, Impaired UE functional use, Decreased strength, Decreased endurance, Decreased range of motion, Decreased activity tolerance, Decreased coordination, Decreased mobility, Increased muscle spasms, Pain, Increased fascial restricitons  Visit Diagnosis: Stiffness of right shoulder, not elsewhere classified  Right shoulder pain, unspecified chronicity  Muscle weakness (generalized)     Problem List Patient Active Problem List   Diagnosis Date Noted  . Problems with swallowing and mastication   . Globus hystericus   . Gastritis   . Hiatal hernia     Myrene GalasWesley Jonnie Kubly, PT DPT 05/21/2016, 2:44 PM  [place] [facility] [address] [place], [place], [address] Phone: [phone]   Fax:  [phone]  Name: Erin Lucero MRN: [id number] Date of Birth: 22-Jun-1960

## 2016-05-23 MED FILL — ZOLPIDEM TARTRATE 5 MG TAB: 5 | 30 days supply | Qty: 30 | Fill #0

## 2016-05-23 MED FILL — ALPRAZolam 0.5 MG TABS: 0.5 | 30 days supply | Qty: 60 | Fill #0 | Status: TO

## 2016-05-27 ENCOUNTER — Ambulatory Visit: Payer: 59 | Attending: Orthopedic Surgery

## 2016-05-27 DIAGNOSIS — M25511 Pain in right shoulder: Secondary | ICD-10-CM | POA: Insufficient documentation

## 2016-05-27 DIAGNOSIS — M6281 Muscle weakness (generalized): Secondary | ICD-10-CM | POA: Insufficient documentation

## 2016-05-27 DIAGNOSIS — M25611 Stiffness of right shoulder, not elsewhere classified: Secondary | ICD-10-CM | POA: Diagnosis not present

## 2016-05-27 NOTE — Therapy (Signed)
[place] [facility] MAIN [facility] SERVICES [address] [place], [place], [address] Phone: [phone]   Fax:  [phone]  Physical Therapy Treatment  Patient Details  Name: Erin Lucero MRN: [id number] Date of Birth: 1960-06-12 Referring Provider: Jones Broom MD  Encounter Date: 05/27/2016      PT End of Session - 05/27/16 1526    Visit Number 5   Number of Visits 12   PT Start Time 1500   PT Stop Time 1545   PT Time Calculation (min) 45 min   Activity Tolerance Patient tolerated treatment well   Behavior During Therapy [facility] for tasks assessed/performed      Past Medical History:  Diagnosis Date  . Arthritis    right great toe  . Esophageal spasm   . GERD (gastroesophageal reflux disease)   . Wears contact lenses     Past Surgical History:  Procedure Laterality Date  . BREAST ENHANCEMENT SURGERY  2005  . COLONOSCOPY    . ESOPHAGOGASTRODUODENOSCOPY (EGD) WITH PROPOFOL N/A 08/30/2015   Procedure: ESOPHAGOGASTRODUODENOSCOPY (EGD) WITH PROPOFOL;  Surgeon: Midge Minium, MD;  Location: [facility];  Service: Endoscopy;  Laterality: N/A;  . SHOULDER ARTHROSCOPY WITH SUBACROMIAL DECOMPRESSION, ROTATOR CUFF REPAIR AND BICEP TENDON REPAIR  02/02/2012   Procedure: SHOULDER ARTHROSCOPY WITH SUBACROMIAL DECOMPRESSION, ROTATOR CUFF REPAIR AND BICEP TENDON REPAIR;  Surgeon: Mable Paris, MD;  Location: [facility];  Service: Orthopedics;  Laterality: Left;  Possible Bicep Tendondesis, open tenodesis    There were no vitals filed for this visit.      Subjective Assessment - 05/27/16 1519    Subjective Patient reports her shoulder is getting better but states it's not yet back to normallcy    Pertinent History Previous RCR on L Shoulder   Limitations Lifting   Diagnostic tests Ultrasound - positive for enlarged bursa by pt report   Patient Stated Goals Would like to be able to move arm normally again.    Currently in  Pain? No/denies   Pain Onset More than a month ago      TREATMENT Manual therapy: STM utilizing superficial and light deep techniques to decrease pain and spasms to the biceps tendon with patient positioned in supine.  Superior->Inferior III/IV with patient in supine - 3 x 30sec; Ant -> Post glides grade III/IV - 3 x 30sec on R side.   Therapeutic Exercise Standing retraction at MATRIX - 22.5# x25 Low Row at MATRIX - 22.5# x 20  Bicep curls on R side - 3 x 20 #9  Shoulder flexion in supination with 1# weight, 2# weight - x 20 Y,T, W's while facing wall - x 8 stopped due to fatigue *Patient performed shoulder exercises throughout decreased AROM to not aggravate pain       PT Education - 05/27/16 1525    Education provided Yes   Education Details form/technique with exercise   Person(s) Educated Patient   Methods Explanation;Demonstration   Comprehension Verbalized understanding;Returned demonstration             PT Long Term Goals - 05/01/16 1354      PT LONG TERM GOAL #1   Title Patient will improve QuickDASH to <10% to indicate significant improvement in shoulder function and improvement    Baseline QuickDASH: 48%   Time 6   Period Weeks   Status New     PT LONG TERM GOAL #2   Title Patient will be able to move shoulder throughout full  AROM  for shoulder ER, abduction and flexion in the R UE without pain to demonstrate greater ability to perform recreational activities   Baseline Shoulder abd: 110, Shoulder ER: 40, shoulder flexion 170   Time 6   Period Weeks   Status New     PT LONG TERM GOAL #3   Title Patient will be independent with HEP to continue benefits from therapy after discharge from physical therapy   Baseline Patient dependent for exercise form/technique   Time 6   Period Weeks   Status New               Plan - 05/27/16 1545    Clinical Impression Statement Patient continues to demonstrate improvement with UE function with ability to  raise sarm overhead without pain and able to perform against resistance. Patient continues to demonstrate biceps/coracobrachialis tendon dysfunction and will benefit from further skilled therapy to return to prior level of function.    Rehab Potential Good   Clinical Impairments Affecting Rehab Potential (+)age, low irratibility (-): Chronicity of injury   PT Frequency 2x / week   PT Duration 6 weeks   PT Treatment/Interventions Therapeutic exercise;Iontophoresis /ml Dexamethasone;ADLs/Self Care Home Management;Electrical Stimulation;Moist Heat;Ultrasound;Cryotherapy;Patient/family education;Neuromuscular re-education;Manual techniques;Passive range of motion;Dry needling   PT Next Visit Plan Progress mobility and strengthening exercises   PT Home Exercise Plan Scapular retractions, AAROM shoulder flexion, Shoulder Abduction   Consulted and Agree with Plan of Care Patient      Patient will benefit from skilled therapeutic intervention in order to improve the following deficits and impairments:  Impaired flexibility, Impaired UE functional use, Decreased strength, Decreased endurance, Decreased range of motion, Decreased activity tolerance, Decreased coordination, Decreased mobility, Increased muscle spasms, Pain, Increased fascial restricitons  Visit Diagnosis: Stiffness of right shoulder, not elsewhere classified  Right shoulder pain, unspecified chronicity  Muscle weakness (generalized)     Problem List Patient Active Problem List   Diagnosis Date Noted  . Problems with swallowing and mastication   . Globus hystericus   . Gastritis   . Hiatal hernia     Myrene Galas, PT DPT 05/27/2016, 3:48 PM  [place] [facility] MAIN [facility] SERVICES [address] [place], [place], [address] Phone: [phone]   Fax:  [phone]  Name: Erin Lucero MRN: [id number] Date of Birth: Jun 26, 1960

## 2016-05-29 ENCOUNTER — Ambulatory Visit: Payer: 59

## 2016-06-03 ENCOUNTER — Ambulatory Visit: Payer: 59

## 2016-06-03 DIAGNOSIS — M6281 Muscle weakness (generalized): Secondary | ICD-10-CM | POA: Diagnosis not present

## 2016-06-03 DIAGNOSIS — M25511 Pain in right shoulder: Secondary | ICD-10-CM | POA: Diagnosis not present

## 2016-06-03 DIAGNOSIS — M25611 Stiffness of right shoulder, not elsewhere classified: Secondary | ICD-10-CM

## 2016-06-03 NOTE — Therapy (Signed)
[place] [facility] MAIN [facility] SERVICES [address] [place], [place], [address] Phone: [phone]   Fax:  [phone]  Physical Therapy Treatment  Patient Details  Name: Erin Lucero MRN: [id number] Date of Birth: 1960/07/30 Referring Provider: Jones Broom MD  Encounter Date: 06/03/2016      PT End of Session - 06/03/16 1534    Visit Number 6   Number of Visits 12   PT Start Time 1500   PT Stop Time 1527   PT Time Calculation (min) 27 min   Activity Tolerance Patient tolerated treatment well   Behavior During Therapy [facility] for tasks assessed/performed      Past Medical History:  Diagnosis Date  . Arthritis    right great toe  . Esophageal spasm   . GERD (gastroesophageal reflux disease)   . Wears contact lenses     Past Surgical History:  Procedure Laterality Date  . BREAST ENHANCEMENT SURGERY  2005  . COLONOSCOPY    . ESOPHAGOGASTRODUODENOSCOPY (EGD) WITH PROPOFOL N/A 08/30/2015   Procedure: ESOPHAGOGASTRODUODENOSCOPY (EGD) WITH PROPOFOL;  Surgeon: Midge Minium, MD;  Location: [facility];  Service: Endoscopy;  Laterality: N/A;  . SHOULDER ARTHROSCOPY WITH SUBACROMIAL DECOMPRESSION, ROTATOR CUFF REPAIR AND BICEP TENDON REPAIR  02/02/2012   Procedure: SHOULDER ARTHROSCOPY WITH SUBACROMIAL DECOMPRESSION, ROTATOR CUFF REPAIR AND BICEP TENDON REPAIR;  Surgeon: Mable Paris, MD;  Location: [facility];  Service: Orthopedics;  Laterality: Left;  Possible Bicep Tendondesis, open tenodesis    There were no vitals filed for this visit.      Subjective Assessment - 06/03/16 1532    Subjective Patient reports her shoulder hurts more when lifting up the center consule in her car but states her shoulder is feeling better overall   Pertinent History Previous RCR on L Shoulder   Limitations Lifting   Diagnostic tests Ultrasound - positive for enlarged bursa by pt report   Patient Stated Goals Would like to be  able to move arm normally again.    Currently in Pain? No/denies   Pain Onset More than a month ago      TREATMENT Manual therapy: STM utilizing superficial and light deep techniques to decrease pain and spasms to the biceps tendon with patient positioned in supine.  Superior->Inferior III/IV with patient in supine - 3 x 30sec; Ant -> Post glides grade III/IV - 3 x 30sec on R side.   Therapeutic Exercise Bicep curls on R side at MATRIX in standing - 2.5# 2 x 20  Shoulder flexion in supination with against YTB - 2 x 20 Resisted Y's with focus on improving thoracic extension and improving speed - x 45sec B Shoulder ER with RTB - x 10; YTB - x 10; without resistance - x 10  Body Blade overhead forward and up/down - 20sec each direction; With arms at 90 degree - x 20sec *Patient performed shoulder exercises throughout decreased AROM to not aggravate pain      PT Education - 06/03/16 1533    Education provided Yes   Education Details form/technique with exercise   Person(s) Educated Patient   Methods Explanation;Demonstration   Comprehension Returned demonstration;Verbalized understanding             PT Long Term Goals - 05/01/16 1354      PT LONG TERM GOAL #1   Title Patient will improve QuickDASH to <10% to indicate significant improvement in shoulder function and improvement    Baseline QuickDASH: 48%  Time 6   Period Weeks   Status New     PT LONG TERM GOAL #2   Title Patient will be able to move shoulder throughout full AROM  for shoulder ER, abduction and flexion in the R UE without pain to demonstrate greater ability to perform recreational activities   Baseline Shoulder abd: 110, Shoulder ER: 40, shoulder flexion 170   Time 6   Period Weeks   Status New     PT LONG TERM GOAL #3   Title Patient will be independent with HEP to continue benefits from therapy after discharge from physical therapy   Baseline Patient dependent for exercise form/technique   Time 6    Period Weeks   Status New               Plan - 06/03/16 1534    Clinical Impression Statement Patient demonstrates increased shoulder pain when performing end range shoulder ER and increased pain when performing resisted ER and shoulder flex/abduction. Patient demonstrates ability to perform full range shoulder abd and flexion without pain indicating functional improvement/carryover and pt will benefit from further skilled therapy to return to prior level of function.    Rehab Potential Good   Clinical Impairments Affecting Rehab Potential (+)age, low irratibility (-): Chronicity of injury   PT Frequency 2x / week   PT Duration 6 weeks   PT Treatment/Interventions Therapeutic exercise;Iontophoresis /ml Dexamethasone;ADLs/Self Care Home Management;Electrical Stimulation;Moist Heat;Ultrasound;Cryotherapy;Patient/family education;Neuromuscular re-education;Manual techniques;Passive range of motion;Dry needling   PT Next Visit Plan Progress mobility and strengthening exercises   PT Home Exercise Plan Scapular retractions, AAROM shoulder flexion, Shoulder Abduction   Consulted and Agree with Plan of Care Patient      Patient will benefit from skilled therapeutic intervention in order to improve the following deficits and impairments:  Impaired flexibility, Impaired UE functional use, Decreased strength, Decreased endurance, Decreased range of motion, Decreased activity tolerance, Decreased coordination, Decreased mobility, Increased muscle spasms, Pain, Increased fascial restricitons  Visit Diagnosis: Stiffness of right shoulder, not elsewhere classified  Right shoulder pain, unspecified chronicity  Muscle weakness (generalized)     Problem List Patient Active Problem List   Diagnosis Date Noted  . Problems with swallowing and mastication   . Globus hystericus   . Gastritis   . Hiatal hernia     Myrene Galas, PT DPT 06/03/2016, 3:49 PM   [facility] MAIN [facility] SERVICES [address] [place], [place], [address] Phone: [phone]   Fax:  [phone]  Name: Erin Lucero MRN: [id number] Date of Birth: October 24, 1960

## 2016-06-11 DIAGNOSIS — M25511 Pain in right shoulder: Secondary | ICD-10-CM | POA: Diagnosis not present

## 2016-06-12 ENCOUNTER — Other Ambulatory Visit: Payer: Self-pay | Admitting: Orthopedic Surgery

## 2016-06-12 DIAGNOSIS — M67911 Unspecified disorder of synovium and tendon, right shoulder: Secondary | ICD-10-CM

## 2016-06-20 MED FILL — ZOLPIDEM TARTRATE 5 MG TAB: 5 | 30 days supply | Qty: 30 | Fill #1

## 2016-07-04 DIAGNOSIS — G4700 Insomnia, unspecified: Secondary | ICD-10-CM | POA: Diagnosis not present

## 2016-07-04 DIAGNOSIS — F411 Generalized anxiety disorder: Secondary | ICD-10-CM | POA: Diagnosis not present

## 2016-07-11 ENCOUNTER — Ambulatory Visit
Admission: RE | Admit: 2016-07-11 | Discharge: 2016-07-11 | Disposition: A | Discharge status: Home / Self Care | Payer: 59 | Source: Ambulatory Visit | Attending: Family Medicine | Admitting: Family Medicine

## 2016-07-11 DIAGNOSIS — Z1231 Encounter for screening mammogram for malignant neoplasm of breast: Secondary | ICD-10-CM

## 2016-07-25 ENCOUNTER — Ambulatory Visit: Payer: 59

## 2016-07-30 ENCOUNTER — Ambulatory Visit
Admission: RE | Admit: 2016-07-30 | Discharge: 2016-07-30 | Disposition: A | Discharge status: Home / Self Care | Payer: 59 | Source: Ambulatory Visit | Attending: Orthopedic Surgery | Admitting: Orthopedic Surgery

## 2016-07-30 DIAGNOSIS — M75101 Unspecified rotator cuff tear or rupture of right shoulder, not specified as traumatic: Secondary | ICD-10-CM | POA: Insufficient documentation

## 2016-07-30 DIAGNOSIS — M7581 Other shoulder lesions, right shoulder: Secondary | ICD-10-CM | POA: Diagnosis not present

## 2016-07-30 DIAGNOSIS — M67911 Unspecified disorder of synovium and tendon, right shoulder: Secondary | ICD-10-CM

## 2016-07-30 DIAGNOSIS — M25511 Pain in right shoulder: Secondary | ICD-10-CM | POA: Diagnosis not present

## 2016-08-08 DIAGNOSIS — M75111 Incomplete rotator cuff tear or rupture of right shoulder, not specified as traumatic: Secondary | ICD-10-CM | POA: Diagnosis not present

## 2016-08-08 DIAGNOSIS — F411 Generalized anxiety disorder: Secondary | ICD-10-CM | POA: Diagnosis not present

## 2016-08-08 DIAGNOSIS — G4700 Insomnia, unspecified: Secondary | ICD-10-CM | POA: Diagnosis not present

## 2016-08-18 DIAGNOSIS — F458 Other somatoform disorders: Secondary | ICD-10-CM | POA: Diagnosis not present

## 2016-08-18 DIAGNOSIS — R070 Pain in throat: Secondary | ICD-10-CM | POA: Diagnosis not present

## 2016-08-28 ENCOUNTER — Other Ambulatory Visit: Payer: Self-pay | Admitting: Orthopedic Surgery

## 2016-09-19 DIAGNOSIS — F411 Generalized anxiety disorder: Secondary | ICD-10-CM | POA: Diagnosis not present

## 2016-09-19 DIAGNOSIS — G4700 Insomnia, unspecified: Secondary | ICD-10-CM | POA: Diagnosis not present

## 2016-09-25 ENCOUNTER — Ambulatory Visit: Admit: 2016-09-25 | Payer: 59 | Admitting: Orthopedic Surgery

## 2016-09-25 SURGERY — SHOULDER ARTHROSCOPY WITH ROTATOR CUFF REPAIR AND SUBACROMIAL DECOMPRESSION
Anesthesia: Choice | Laterality: Right

## 2016-09-29 ENCOUNTER — Other Ambulatory Visit: Payer: Self-pay | Admitting: Unknown Physician Specialty

## 2016-09-29 DIAGNOSIS — R0989 Other specified symptoms and signs involving the circulatory and respiratory systems: Secondary | ICD-10-CM

## 2016-09-29 DIAGNOSIS — R198 Other specified symptoms and signs involving the digestive system and abdomen: Secondary | ICD-10-CM

## 2016-09-29 DIAGNOSIS — R09A2 Foreign body sensation, throat: Secondary | ICD-10-CM

## 2016-10-01 ENCOUNTER — Ambulatory Visit
Admission: RE | Admit: 2016-10-01 | Discharge: 2016-10-01 | Disposition: A | Discharge status: Home / Self Care | Payer: 59 | Source: Ambulatory Visit | Attending: Unknown Physician Specialty | Admitting: Unknown Physician Specialty

## 2016-10-01 DIAGNOSIS — F458 Other somatoform disorders: Secondary | ICD-10-CM | POA: Diagnosis not present

## 2016-10-01 DIAGNOSIS — R070 Pain in throat: Secondary | ICD-10-CM | POA: Insufficient documentation

## 2016-10-01 DIAGNOSIS — R0989 Other specified symptoms and signs involving the circulatory and respiratory systems: Secondary | ICD-10-CM

## 2016-10-01 DIAGNOSIS — R198 Other specified symptoms and signs involving the digestive system and abdomen: Secondary | ICD-10-CM

## 2016-10-30 DIAGNOSIS — H16041 Marginal corneal ulcer, right eye: Secondary | ICD-10-CM | POA: Diagnosis not present

## 2016-10-30 DIAGNOSIS — H18823 Corneal disorder due to contact lens, bilateral: Secondary | ICD-10-CM | POA: Diagnosis not present

## 2016-10-30 DIAGNOSIS — H10413 Chronic giant papillary conjunctivitis, bilateral: Secondary | ICD-10-CM | POA: Diagnosis not present

## 2016-11-03 DIAGNOSIS — H10413 Chronic giant papillary conjunctivitis, bilateral: Secondary | ICD-10-CM | POA: Diagnosis not present

## 2016-11-17 DIAGNOSIS — H10413 Chronic giant papillary conjunctivitis, bilateral: Secondary | ICD-10-CM | POA: Diagnosis not present

## 2016-12-09 DIAGNOSIS — G4700 Insomnia, unspecified: Secondary | ICD-10-CM | POA: Diagnosis not present

## 2016-12-09 DIAGNOSIS — F411 Generalized anxiety disorder: Secondary | ICD-10-CM | POA: Diagnosis not present

## 2017-01-09 DIAGNOSIS — F411 Generalized anxiety disorder: Secondary | ICD-10-CM | POA: Diagnosis not present

## 2017-01-09 DIAGNOSIS — G4700 Insomnia, unspecified: Secondary | ICD-10-CM | POA: Diagnosis not present

## 2017-02-20 DIAGNOSIS — J069 Acute upper respiratory infection, unspecified: Secondary | ICD-10-CM | POA: Diagnosis not present

## 2017-02-20 DIAGNOSIS — H66001 Acute suppurative otitis media without spontaneous rupture of ear drum, right ear: Secondary | ICD-10-CM | POA: Diagnosis not present

## 2017-02-26 DIAGNOSIS — Z01419 Encounter for gynecological examination (general) (routine) without abnormal findings: Secondary | ICD-10-CM | POA: Diagnosis not present

## 2017-02-26 DIAGNOSIS — Z13820 Encounter for screening for osteoporosis: Secondary | ICD-10-CM | POA: Diagnosis not present

## 2017-03-03 DIAGNOSIS — H903 Sensorineural hearing loss, bilateral: Secondary | ICD-10-CM | POA: Diagnosis not present

## 2017-03-03 DIAGNOSIS — H6980 Other specified disorders of Eustachian tube, unspecified ear: Secondary | ICD-10-CM | POA: Diagnosis not present

## 2017-03-26 IMAGING — CR DG CHEST 2V
1 series · 2 of 2 positions shown · non-contrast
Comparison: None.

CLINICAL DATA: Cardiac palpitations with dizziness

EXAM:
CHEST  2 VIEW

[Series 1: w chest pa · 0.14mm/px · 2 of 2 slices shown]
[im 1/2]
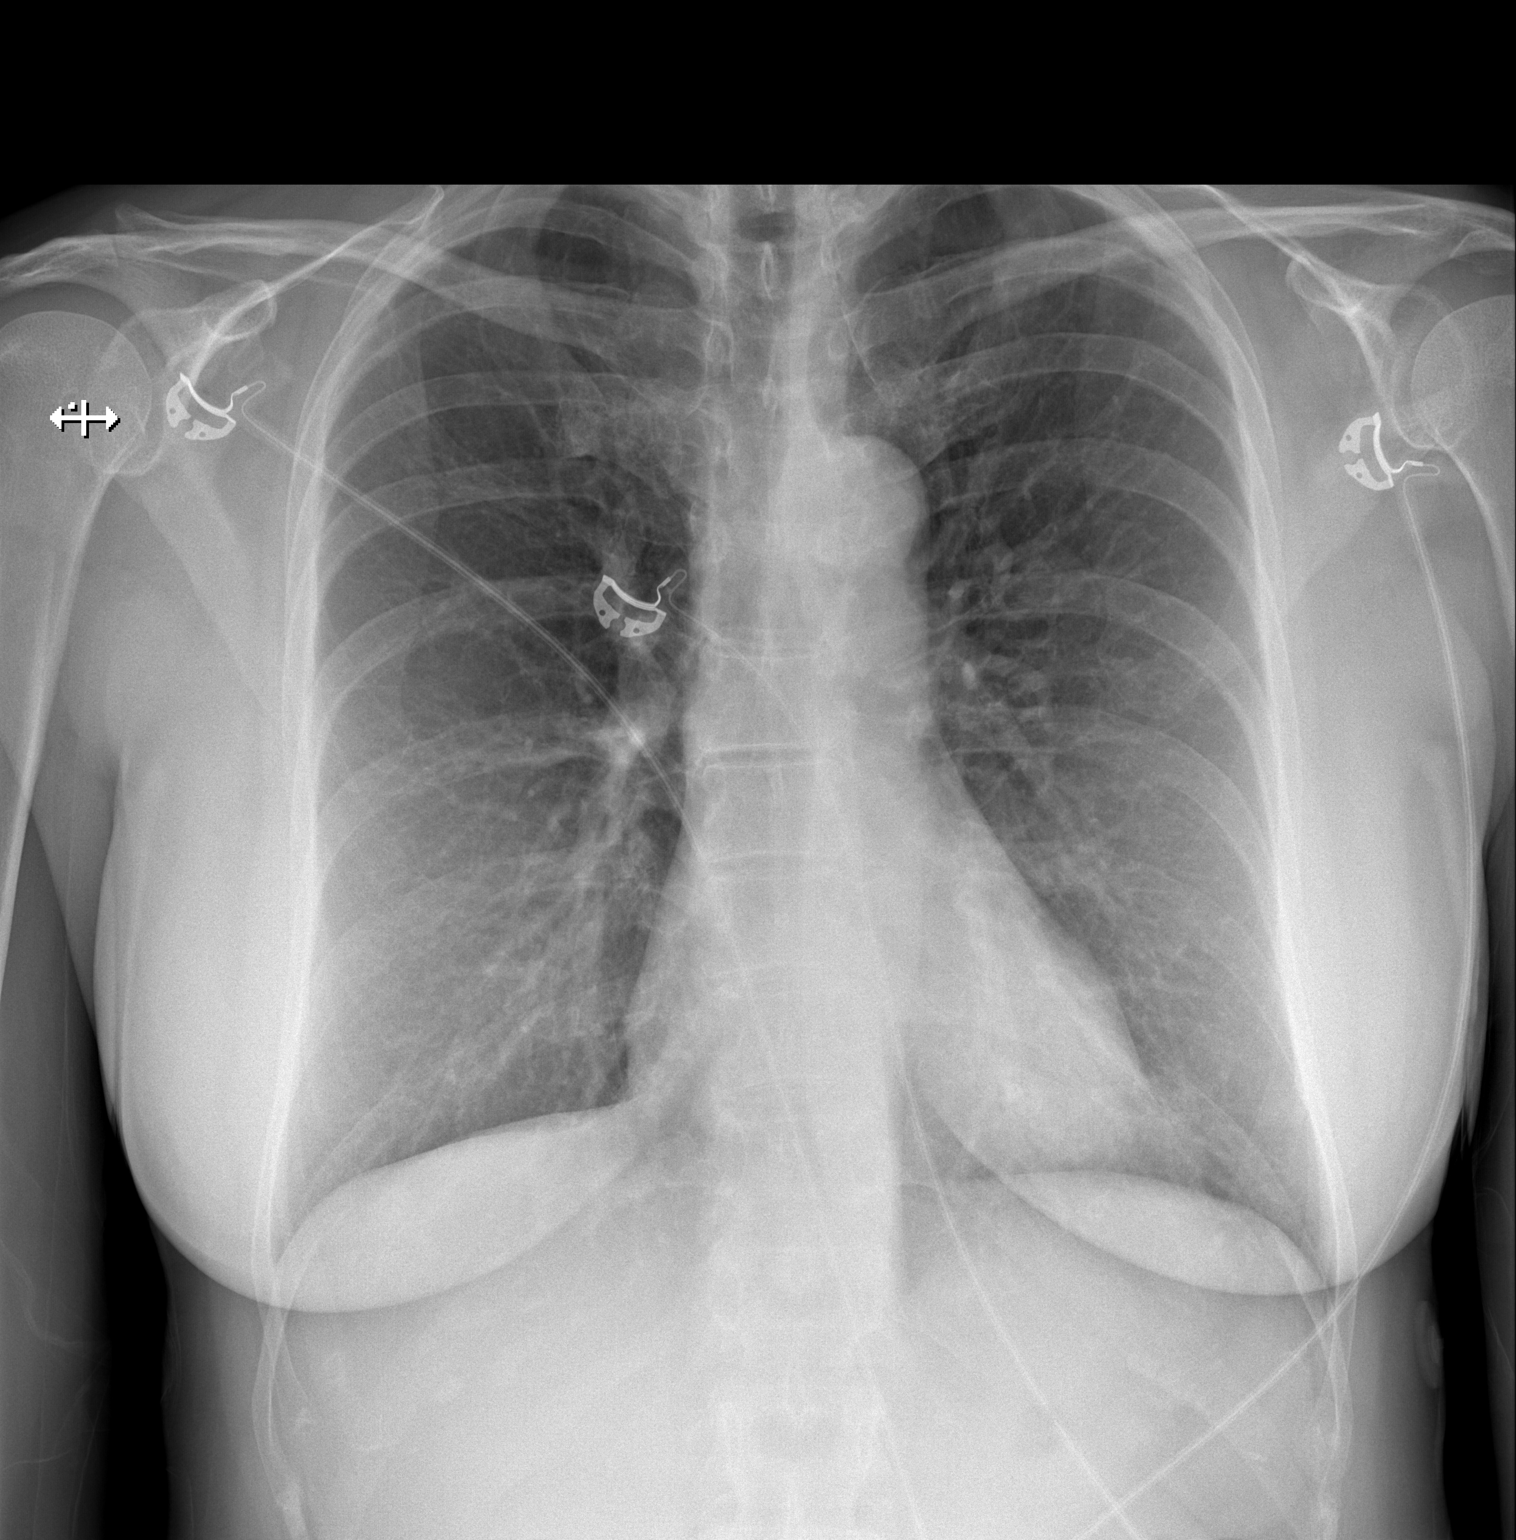
[im 2/2]
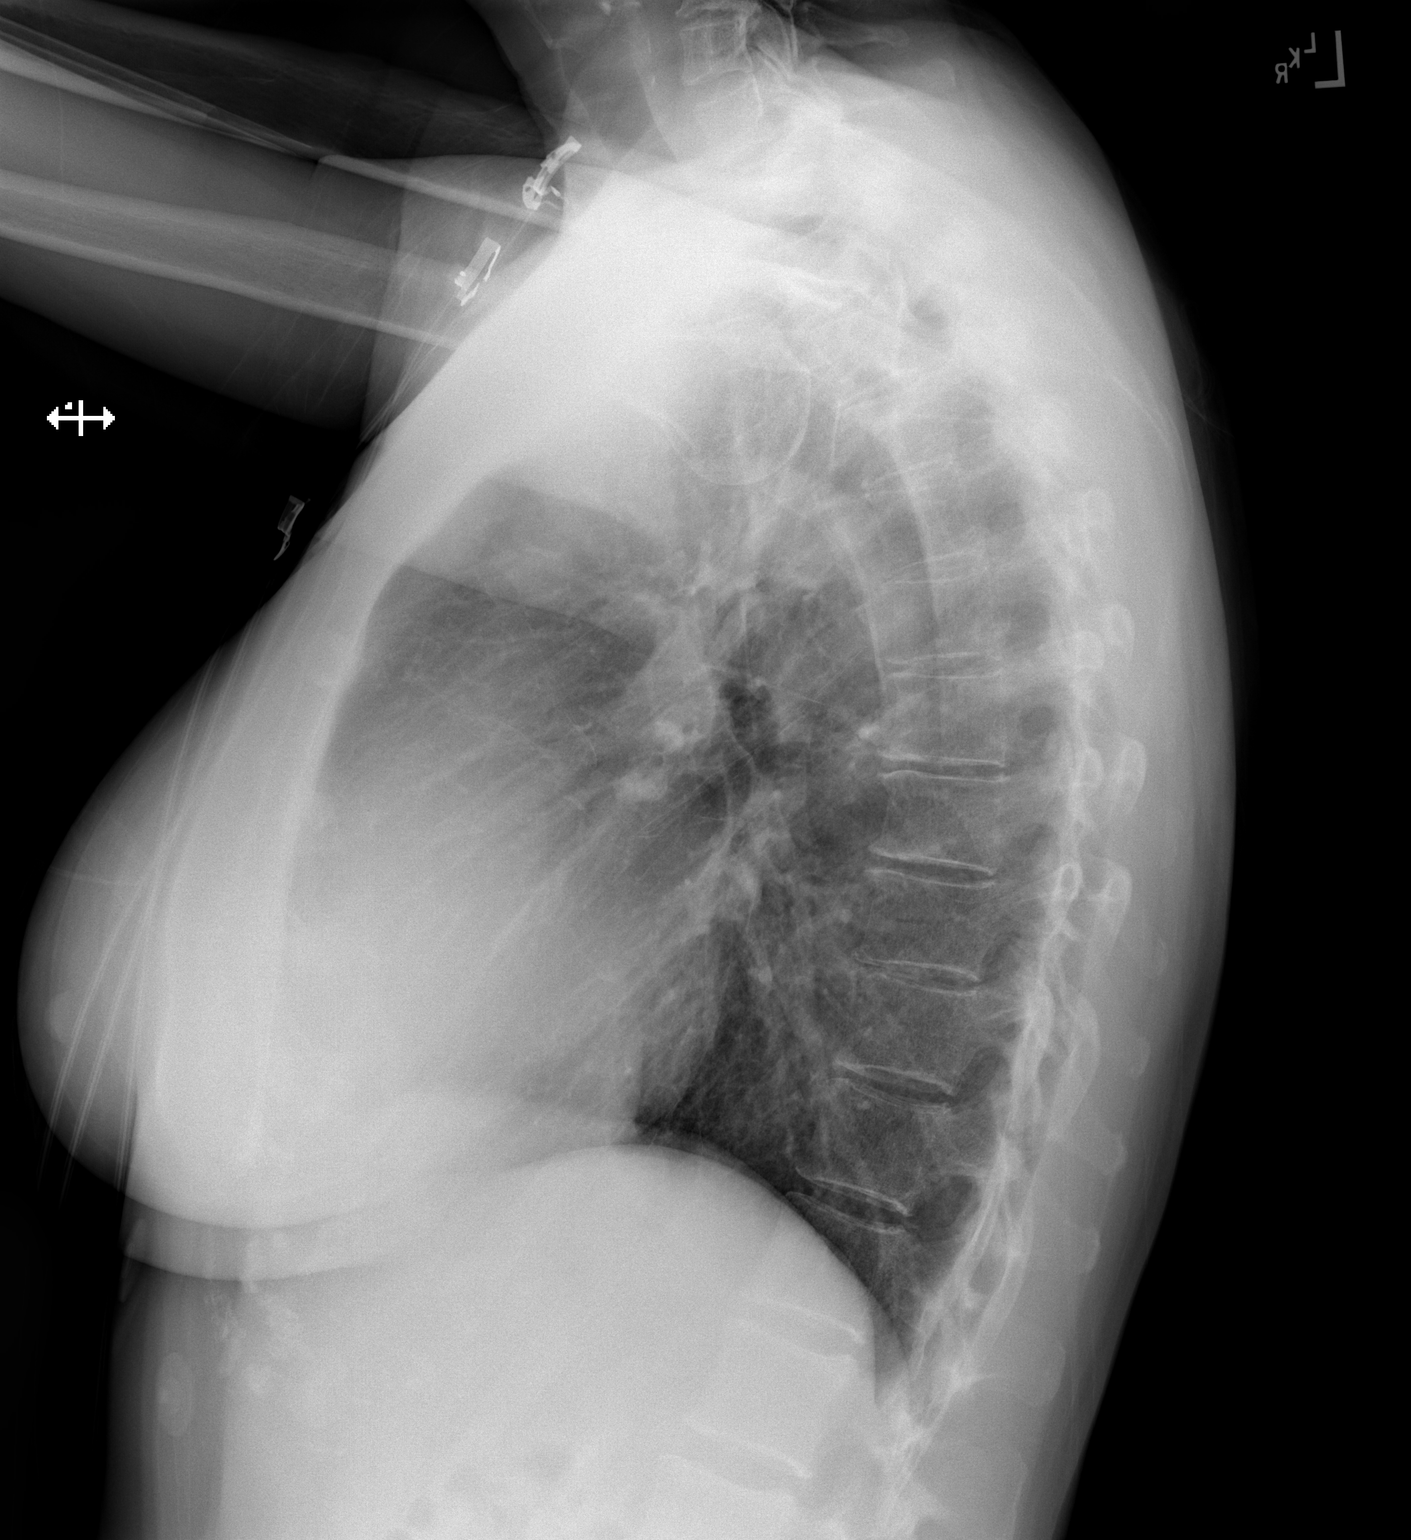

[2 of 2 positions shown; findings below may reference images not displayed]

FINDINGS: Lungs are clear. Heart size and pulmonary vascularity are normal. No
adenopathy. No bone lesions.
IMPRESSION: No edema or consolidation.

## 2017-04-15 DIAGNOSIS — F411 Generalized anxiety disorder: Secondary | ICD-10-CM | POA: Diagnosis not present

## 2017-04-15 DIAGNOSIS — G4700 Insomnia, unspecified: Secondary | ICD-10-CM | POA: Diagnosis not present

## 2017-04-23 DIAGNOSIS — Z780 Asymptomatic menopausal state: Secondary | ICD-10-CM | POA: Diagnosis not present

## 2017-04-23 DIAGNOSIS — Z13820 Encounter for screening for osteoporosis: Secondary | ICD-10-CM | POA: Diagnosis not present

## 2017-06-02 DIAGNOSIS — M2392 Unspecified internal derangement of left knee: Secondary | ICD-10-CM | POA: Diagnosis not present

## 2017-06-02 DIAGNOSIS — G8929 Other chronic pain: Secondary | ICD-10-CM | POA: Diagnosis not present

## 2017-06-02 DIAGNOSIS — M25562 Pain in left knee: Secondary | ICD-10-CM | POA: Diagnosis not present

## 2017-06-18 DIAGNOSIS — F411 Generalized anxiety disorder: Secondary | ICD-10-CM | POA: Diagnosis not present

## 2017-06-18 DIAGNOSIS — G4700 Insomnia, unspecified: Secondary | ICD-10-CM | POA: Diagnosis not present

## 2017-07-02 DIAGNOSIS — M25775 Osteophyte, left foot: Secondary | ICD-10-CM | POA: Diagnosis not present

## 2017-07-02 DIAGNOSIS — L600 Ingrowing nail: Secondary | ICD-10-CM | POA: Diagnosis not present

## 2017-07-03 DIAGNOSIS — J069 Acute upper respiratory infection, unspecified: Secondary | ICD-10-CM | POA: Diagnosis not present

## 2017-07-03 DIAGNOSIS — R05 Cough: Secondary | ICD-10-CM | POA: Diagnosis not present

## 2017-07-03 DIAGNOSIS — R062 Wheezing: Secondary | ICD-10-CM | POA: Diagnosis not present

## 2017-07-09 DIAGNOSIS — M19071 Primary osteoarthritis, right ankle and foot: Secondary | ICD-10-CM | POA: Diagnosis not present

## 2017-07-14 DIAGNOSIS — J069 Acute upper respiratory infection, unspecified: Secondary | ICD-10-CM | POA: Diagnosis not present

## 2017-07-14 DIAGNOSIS — L03032 Cellulitis of left toe: Secondary | ICD-10-CM | POA: Diagnosis not present

## 2017-07-14 DIAGNOSIS — R05 Cough: Secondary | ICD-10-CM | POA: Diagnosis not present

## 2017-07-22 ENCOUNTER — Ambulatory Visit: Payer: 59 | Admitting: Podiatry

## 2017-07-31 DIAGNOSIS — F411 Generalized anxiety disorder: Secondary | ICD-10-CM | POA: Diagnosis not present

## 2017-07-31 DIAGNOSIS — G4700 Insomnia, unspecified: Secondary | ICD-10-CM | POA: Diagnosis not present

## 2017-08-03 ENCOUNTER — Ambulatory Visit: Payer: 59 | Admitting: Podiatry

## 2017-08-06 DIAGNOSIS — H5213 Myopia, bilateral: Secondary | ICD-10-CM | POA: Diagnosis not present

## 2017-10-13 DIAGNOSIS — G4700 Insomnia, unspecified: Secondary | ICD-10-CM | POA: Diagnosis not present

## 2017-10-13 DIAGNOSIS — F411 Generalized anxiety disorder: Secondary | ICD-10-CM | POA: Diagnosis not present

## 2017-11-04 DIAGNOSIS — F411 Generalized anxiety disorder: Secondary | ICD-10-CM | POA: Diagnosis not present

## 2017-11-04 DIAGNOSIS — G4700 Insomnia, unspecified: Secondary | ICD-10-CM | POA: Diagnosis not present

## 2017-11-19 DIAGNOSIS — H524 Presbyopia: Secondary | ICD-10-CM | POA: Diagnosis not present

## 2017-11-19 DIAGNOSIS — H5213 Myopia, bilateral: Secondary | ICD-10-CM | POA: Diagnosis not present

## 2017-11-30 ENCOUNTER — Other Ambulatory Visit: Payer: Self-pay | Admitting: Orthopedic Surgery

## 2017-11-30 DIAGNOSIS — G8929 Other chronic pain: Secondary | ICD-10-CM

## 2017-11-30 DIAGNOSIS — M25562 Pain in left knee: Principal | ICD-10-CM

## 2017-12-02 ENCOUNTER — Other Ambulatory Visit: Payer: Self-pay | Admitting: Family Medicine

## 2017-12-02 DIAGNOSIS — Z1231 Encounter for screening mammogram for malignant neoplasm of breast: Secondary | ICD-10-CM

## 2017-12-18 ENCOUNTER — Ambulatory Visit
Admission: RE | Admit: 2017-12-18 | Discharge: 2017-12-18 | Disposition: A | Discharge status: Home / Self Care | Payer: 59 | Source: Ambulatory Visit | Attending: Orthopedic Surgery | Admitting: Orthopedic Surgery

## 2017-12-18 DIAGNOSIS — G4700 Insomnia, unspecified: Secondary | ICD-10-CM | POA: Diagnosis not present

## 2017-12-18 DIAGNOSIS — X58XXXA Exposure to other specified factors, initial encounter: Secondary | ICD-10-CM | POA: Diagnosis not present

## 2017-12-18 DIAGNOSIS — M25562 Pain in left knee: Secondary | ICD-10-CM

## 2017-12-18 DIAGNOSIS — G8929 Other chronic pain: Secondary | ICD-10-CM | POA: Diagnosis not present

## 2017-12-18 DIAGNOSIS — S83242A Other tear of medial meniscus, current injury, left knee, initial encounter: Secondary | ICD-10-CM | POA: Diagnosis not present

## 2017-12-18 DIAGNOSIS — F411 Generalized anxiety disorder: Secondary | ICD-10-CM | POA: Diagnosis not present

## 2017-12-18 DIAGNOSIS — M1712 Unilateral primary osteoarthritis, left knee: Secondary | ICD-10-CM | POA: Insufficient documentation

## 2017-12-22 DIAGNOSIS — M2392 Unspecified internal derangement of left knee: Secondary | ICD-10-CM | POA: Diagnosis not present

## 2017-12-24 DIAGNOSIS — S83242A Other tear of medial meniscus, current injury, left knee, initial encounter: Secondary | ICD-10-CM | POA: Diagnosis not present

## 2018-01-11 ENCOUNTER — Ambulatory Visit: Payer: Self-pay

## 2018-01-12 DIAGNOSIS — S83242D Other tear of medial meniscus, current injury, left knee, subsequent encounter: Secondary | ICD-10-CM | POA: Diagnosis not present

## 2018-01-12 DIAGNOSIS — M1712 Unilateral primary osteoarthritis, left knee: Secondary | ICD-10-CM | POA: Diagnosis not present

## 2018-01-12 DIAGNOSIS — Z9889 Other specified postprocedural states: Secondary | ICD-10-CM | POA: Diagnosis not present

## 2018-02-03 DIAGNOSIS — G8918 Other acute postprocedural pain: Secondary | ICD-10-CM | POA: Diagnosis not present

## 2018-02-03 DIAGNOSIS — M94262 Chondromalacia, left knee: Secondary | ICD-10-CM | POA: Diagnosis not present

## 2018-02-03 DIAGNOSIS — M6752 Plica syndrome, left knee: Secondary | ICD-10-CM | POA: Diagnosis not present

## 2018-02-03 DIAGNOSIS — S82232A Displaced oblique fracture of shaft of left tibia, initial encounter for closed fracture: Secondary | ICD-10-CM | POA: Diagnosis not present

## 2018-02-03 DIAGNOSIS — M659 Synovitis and tenosynovitis, unspecified: Secondary | ICD-10-CM | POA: Diagnosis not present

## 2018-02-03 DIAGNOSIS — S83232A Complex tear of medial meniscus, current injury, left knee, initial encounter: Secondary | ICD-10-CM | POA: Diagnosis not present

## 2018-02-03 MED FILL — oxyCODONE HCL 5 MG TABS: 5 | 2 days supply | Qty: 10 | Fill #0

## 2018-02-09 DIAGNOSIS — Z9889 Other specified postprocedural states: Secondary | ICD-10-CM | POA: Insufficient documentation

## 2018-02-10 ENCOUNTER — Ambulatory Visit: Payer: 59

## 2018-02-12 ENCOUNTER — Ambulatory Visit: Payer: 59 | Attending: Orthopedic Surgery | Admitting: Physical Therapy

## 2018-02-12 ENCOUNTER — Encounter: Payer: Self-pay | Admitting: Physical Therapy

## 2018-02-12 DIAGNOSIS — M25562 Pain in left knee: Secondary | ICD-10-CM | POA: Insufficient documentation

## 2018-02-12 DIAGNOSIS — R600 Localized edema: Secondary | ICD-10-CM | POA: Insufficient documentation

## 2018-02-12 DIAGNOSIS — R262 Difficulty in walking, not elsewhere classified: Secondary | ICD-10-CM | POA: Insufficient documentation

## 2018-02-12 DIAGNOSIS — M25662 Stiffness of left knee, not elsewhere classified: Secondary | ICD-10-CM | POA: Insufficient documentation

## 2018-02-12 NOTE — Patient Instructions (Signed)
Step 1  Step 2  Supine Quad Set reps: 20  sets: 1  hold: 10  daily: 3  weekly: 7 Setup  Begin lying on your back with one knee bent and your other leg straight with your knee resting on a towel roll. Movement  Gently squeeze your thigh muscles, pushing the back of your knee down into the towel. Tip  Make sure to keep your back flat against the floor during the exercise. Step 1  Step 2  Supine Active Straight Leg Raise reps: 10  sets: 2  hold: 5  daily: 2  weekly: 7 Setup  Begin lying on your back with one knee bent and your other leg straight. Movement  Engaging your thigh muscles, slowly lift your straight leg until it is parallel with your other thigh, then lower it back to the starting position and repeat. Tip  Make sure to keep your leg straight and do not let your back arch during the exercise. Step 1  Step 2  Step 3  Straight Leg Raise with External Rotation reps: 10  sets: 2  hold: 5  daily: 2  weekly: 7 Setup  Begin by lying on your back with one knee bent and your other leg laying flat.  Movement  Slowly rotate your straight leg outward, then tighten your abdominal muscles and lift it until it is parallel with your other thigh. Tip  Do not let your low back arch during the exercise. Step 1  Step 2  Gastroc Stretch on Wall reps: 3  sets: 1  hold: 30  daily: 2  weekly: 7 Setup  Setup Directions Movement  Begin in a standing upright position in front of a wall.  Tip  Place your hands on the wall and extend one leg straight backward, bending your front leg, until you feel a stretch in the calf of your back leg and hold.  Step 1  Step 2  Supine Hamstring Stretch with Strap reps: 3  sets: 1  hold: 30  daily: 2  weekly: 7 Setup  Begin lying on your back with your legs straight, holding the end of a strap that is looped around one foot.  Movement  Use the strap to pull your leg up toward your body until you feel a gentle stretch in the back of your  upper leg. Hold this position. Tip  Make sure to keep your other leg straight on the ground during the stretch. Disclaimer: This program provides exercises related to your condition that you can perform at home. As there is a risk of injury with any activity, use caution when performing exercises. If you experience any pain or discomfort, discontinue the exercises and contact your health care provider.  Login URL: [email or web address] Access Code: [email or web address] . Date printed: 02/12/2018 Page 1  Step 1  Step 2  Supine ITB Stretch with Strap reps: 3  sets: 1  hold: 30  daily: 2  weekly: 7 Setup  Begin by lying on your back with your legs straight and a strap secured around one foot, holding the end in your opposite hand.  Movement  Pull on the strap to draw your leg diagonally across your body and hold, feeling a stretch on the outside of your leg.  Tip  Make sure to keep your shoulders and hips on the ground during the stretch.

## 2018-02-12 NOTE — Therapy (Signed)
[facility] [address] [place], [place], [address] Phone: [phone]   Fax:  [phone]  Physical Therapy Evaluation  Patient Details  Name: Erin Lucero MRN: [id number] Date of Birth: 1960/03/08 Referring Provider (PT): Dr. Sherlean FootLucey    Encounter Date: 02/12/2018  PT End of Session - 02/12/18 1208    Visit Number  1    Number of Visits  12    Date for PT Re-Evaluation  04/09/18   8 weeks for scheduling    PT Start Time  0940    PT Stop Time  1020    PT Time Calculation (min)  40 min    Activity Tolerance  Patient tolerated treatment well    Behavior During Therapy  [facility] for tasks assessed/performed       Past Medical History:  Diagnosis Date  . Arthritis    right great toe  . Esophageal spasm   . GERD (gastroesophageal reflux disease)   . Wears contact lenses     Past Surgical History:  Procedure Laterality Date  . AUGMENTATION MAMMAPLASTY Bilateral 2001   retro pec saline  . BREAST ENHANCEMENT SURGERY  2005  . COLONOSCOPY    . ESOPHAGOGASTRODUODENOSCOPY (EGD) WITH PROPOFOL N/A 08/30/2015   Procedure: ESOPHAGOGASTRODUODENOSCOPY (EGD) WITH PROPOFOL;  Surgeon: Midge Miniumarren Wohl, MD;  [facility];  Service: Endoscopy;  Laterality: N/A;  . SHOULDER ARTHROSCOPY WITH SUBACROMIAL DECOMPRESSION, ROTATOR CUFF REPAIR AND BICEP TENDON REPAIR  02/02/2012   Procedure: SHOULDER ARTHROSCOPY WITH SUBACROMIAL DECOMPRESSION, ROTATOR CUFF REPAIR AND BICEP TENDON REPAIR;  Surgeon: Mable ParisJustin William Chandler, MD;  Location: [facility];  Service: Orthopedics;  Laterality: Left;  Possible Bicep Tendondesis, open tenodesis    There were no vitals filed for this visit.   Subjective Assessment - 02/12/18 0945    Subjective  Patient with chronic L knee pain which ultimately required meniscectomy 02/03/18.  She is doing well overall.  Has difficulty being up , standing too long.  Complains of stiffness, pain medially.  She has difficulty doing stairs.  She has taken some time off of work to recover.      Pertinent History  Previous RCR on L Shoulder    Limitations  Walking    Diagnostic tests  post horn medial meniscal tear 10/19    Currently in Pain?  No/denies   more stiffness    Pain Location  Knee    Pain Orientation  Left    Pain Descriptors / Indicators  Sharp    Pain Onset  1 to 4 weeks ago    Pain Frequency  Several days a week         [facility] - 02/12/18 0001      Assessment   Medical Diagnosis  L knee mensicectomy     Referring Provider (PT)  Dr. Sherlean FootLucey     Onset Date/Surgical Date  02/03/18    Prior Therapy  Yes for shoulder       Precautions   Precautions  None      Restrictions   Weight Bearing Restrictions  No      Balance Screen   Has the patient fallen in the past 6 months  No      Prior Function   Level of Independence  Independent    Vocation  Full time employment    Vocation Requirements  desk, walking the building, occ assists with patient care     Leisure  fitness, waking, outdoors, family  Cognition   Overall Cognitive Status  Within Functional Limits for tasks assessed      Observation/Other Assessments   Focus on Therapeutic Outcomes (FOTO)   68%      Sensation   Light Touch  Appears Intact      Coordination   Gross Motor Movements are Fluid and Coordinated  Not tested      Posture/Postural Control   Posture/Postural Control  Postural limitations    Posture Comments  decr WB on L       AROM   Right Knee Extension  -5    Right Knee Flexion  140    Left Knee Extension  -15    Left Knee Flexion  126      PROM   Overall PROM Comments  pain end range knee flex, ext      Strength   Right Hip Flexion  4+/5    Right Hip ABduction  4+/5    Left Hip Flexion  4/5    Left Hip ABduction  4/5    Right Knee Flexion  3+/5    Right Knee Extension  4/5    Left Knee Flexion  5/5    Left Knee Extension  5/5      Palpation   Palpation  comment  tender medial joint line and inferior patella , very mild edema at surgical sites      Ambulation/Gait   Gait Pattern  Antalgic     knee flexion with gait   Objective measurements completed on examination: See above findings.      PT Education - 02/12/18 1207    Education provided  Yes    Education Details  HEP, RICE, PT/POC     Person(s) Educated  Patient    Methods  Explanation;Demonstration;Verbal cues;Handout    Comprehension  Verbalized understanding;Returned demonstration          PT Long Term Goals - 02/12/18 1208      PT LONG TERM GOAL #1   Title  Patient will be I with HEP for hip and knee ROM, strength     Time  6    Period  Weeks    Status  New    Target Date  04/09/18      PT LONG TERM GOAL #2   Title  Pt will be able to extend knee to no more than 5 deg lacking extension on L for normal gait mechanics     Time  6    Period  Weeks    Status  New    Target Date  04/09/18      PT LONG TERM GOAL #3   Title  FOTO score will improve to less than 45% impaired to demo functional improvement     Time  6    Period  Weeks    Status  New    Target Date  04/09/18      PT LONG TERM GOAL #4   Title  Pt will be able to squat and lunge without  pain in L knee, controlled range of motion     Time  6    Period  Weeks    Status  New    Target Date  04/09/18      PT LONG TERM GOAL #5   Title  Pt will be able to stand on LLE for 30 sec and perform min dynamic activity    Time  6    Period  Weeks  Status  New    Target Date  04/09/18             Plan - 02/12/18 1211    Clinical Impression Statement  Pt presents for low complexity eval of L knee a/p medial meniscectomy.  She demonstrates decreased L knee AROM (0-15-126 deg), antalgic/abnormal gait and min weakness associated with surgery. She was given basic HEP and had no pain post session.  She is motivated to return to work and normal fitness routine, realizing she may need to add more  flexibility/mobility training into her workouts.      Clinical Presentation  Stable    Clinical Decision Making  Low    Rehab Potential  Excellent    PT Frequency  2x / week    PT Duration  6 weeks   may need more time due to her schedule    PT Treatment/Interventions  ADLs/Self Care Home Management;Cryotherapy;Electrical Stimulation;Moist Heat;Functional mobility training;Therapeutic activities;Orthotic Fit/Training;Ultrasound;Gait training;Therapeutic exercise;Stair training;Balance training;Neuromuscular re-education;Taping;Scar mobilization;Manual techniques;Passive range of motion;Vasopneumatic Device;Patient/family education    PT Next Visit Plan  check HEP, manual, ROM , rec.  bike     PT Home Exercise Plan  quad set, SLR/VMO, hamstring, ITB and gastroc.     Consulted and Agree with Plan of Care  Patient       Patient will benefit from skilled therapeutic intervention in order to improve the following deficits and impairments:  Abnormal gait, Decreased balance, Decreased mobility, Hypomobility, Pain, Impaired flexibility, Increased fascial restricitons, Decreased strength, Decreased range of motion, Decreased scar mobility, Increased edema  Visit Diagnosis: No diagnosis found.     Problem List Patient Active Problem List   Diagnosis Date Noted  . Problems with swallowing and mastication   . Globus hystericus   . Gastritis   . Hiatal hernia     Erin Lucero 02/12/2018, 12:19 PM  [facility] [address] [place], [place], [address] Phone: [phone]   Fax:  [phone]  Name: Erin Lucero MRN: [id number] Date of Birth: 1960-06-14   Karie Mainland, PT 02/12/18 12:19 PM Phone: [phone] Fax: [phone]

## 2018-02-15 ENCOUNTER — Ambulatory Visit: Payer: 59 | Admitting: Physical Therapy

## 2018-02-18 ENCOUNTER — Ambulatory Visit: Payer: 59 | Admitting: Physical Therapy

## 2018-02-26 ENCOUNTER — Ambulatory Visit
Admission: RE | Admit: 2018-02-26 | Discharge: 2018-02-26 | Disposition: A | Discharge status: Home / Self Care | Payer: 59 | Source: Ambulatory Visit | Attending: Family Medicine | Admitting: Family Medicine

## 2018-02-26 ENCOUNTER — Encounter

## 2018-02-26 DIAGNOSIS — Z1231 Encounter for screening mammogram for malignant neoplasm of breast: Secondary | ICD-10-CM

## 2018-03-01 ENCOUNTER — Encounter: Payer: Self-pay | Admitting: Physical Therapy

## 2018-03-01 ENCOUNTER — Ambulatory Visit: Payer: 59 | Attending: Orthopedic Surgery | Admitting: Physical Therapy

## 2018-03-01 DIAGNOSIS — R600 Localized edema: Secondary | ICD-10-CM | POA: Insufficient documentation

## 2018-03-01 DIAGNOSIS — M25511 Pain in right shoulder: Secondary | ICD-10-CM | POA: Insufficient documentation

## 2018-03-01 DIAGNOSIS — M25562 Pain in left knee: Secondary | ICD-10-CM | POA: Diagnosis not present

## 2018-03-01 DIAGNOSIS — M25662 Stiffness of left knee, not elsewhere classified: Secondary | ICD-10-CM | POA: Insufficient documentation

## 2018-03-01 DIAGNOSIS — R262 Difficulty in walking, not elsewhere classified: Secondary | ICD-10-CM | POA: Diagnosis present

## 2018-03-01 DIAGNOSIS — M25611 Stiffness of right shoulder, not elsewhere classified: Secondary | ICD-10-CM | POA: Insufficient documentation

## 2018-03-01 DIAGNOSIS — M6281 Muscle weakness (generalized): Secondary | ICD-10-CM | POA: Diagnosis present

## 2018-03-01 NOTE — Therapy (Signed)
[facility] [address] [place], [place], [address] Phone: [phone]   Fax:  [phone]  Physical Therapy Treatment  Patient Details  Name: Erin Lucero MRN: [id number] Date of Birth: 1960-07-25 Referring Provider (PT): Dr. Sherlean FootLucey    Encounter Date: 03/01/2018  PT End of Session - 03/01/18 1135    Visit Number  2    Number of Visits  12    Date for PT Re-Evaluation  04/09/18    PT Start Time  1117   17 minutes late   PT Stop Time  1145    PT Time Calculation (min)  28 min       Past Medical History:  Diagnosis Date  . Arthritis    right great toe  . Esophageal spasm   . GERD (gastroesophageal reflux disease)   . Wears contact lenses     Past Surgical History:  Procedure Laterality Date  . AUGMENTATION MAMMAPLASTY Bilateral 2001   retro pec saline  . BREAST ENHANCEMENT SURGERY  2005  . COLONOSCOPY    . ESOPHAGOGASTRODUODENOSCOPY (EGD) WITH PROPOFOL N/A 08/30/2015   Procedure: ESOPHAGOGASTRODUODENOSCOPY (EGD) WITH PROPOFOL;  Surgeon: Midge Miniumarren Wohl, MD;  [facility];  Service: Endoscopy;  Laterality: N/A;  . SHOULDER ARTHROSCOPY WITH SUBACROMIAL DECOMPRESSION, ROTATOR CUFF REPAIR AND BICEP TENDON REPAIR  02/02/2012   Procedure: SHOULDER ARTHROSCOPY WITH SUBACROMIAL DECOMPRESSION, ROTATOR CUFF REPAIR AND BICEP TENDON REPAIR;  Surgeon: Mable ParisJustin William Chandler, MD;  Location: [facility];  Service: Orthopedics;  Laterality: Left;  Possible Bicep Tendondesis, open tenodesis    There were no vitals filed for this visit.  Subjective Assessment - 03/01/18 1119    Subjective  I am going up stairs better and getting in and out of the car better. I can extend my knee more.     Currently in Pain?  No/denies         [facility] PT Assessment - 03/01/18 0001      AROM   Left Knee Extension  -10                   [facility] Adult PT Treatment/Exercise - 03/01/18 0001      Knee/Hip  Exercises: Stretches   Active Hamstring Stretch  Left;3 reps    ITB Stretch  Left;3 reps    Gastroc Stretch  3 reps;30 seconds      Knee/Hip Exercises: Aerobic   Recumbent Bike  3 min Level 1       Knee/Hip Exercises: Supine   Quad Sets  20 reps    Quad Sets Limitations  with and without heel prop     Straight Leg Raise with External Rotation  15 reps                  PT Long Term Goals - 02/12/18 1208      PT LONG TERM GOAL #1   Title  Patient will be I with HEP for hip and knee ROM, strength     Time  6    Period  Weeks    Status  New    Target Date  04/09/18      PT LONG TERM GOAL #2   Title  Pt will be able to extend knee to no more than 5 deg lacking extension on L for normal gait mechanics     Time  6    Period  Weeks    Status  New    Target Date  04/09/18      PT LONG TERM GOAL #3   Title  FOTO score will improve to less than 45% impaired to demo functional improvement     Time  6    Period  Weeks    Status  New    Target Date  04/09/18      PT LONG TERM GOAL #4   Title  Pt will be able to squat and lunge without  pain in L knee, controlled range of motion     Time  6    Period  Weeks    Status  New    Target Date  04/09/18      PT LONG TERM GOAL #5   Title  Pt will be able to stand on LLE for 30 sec and perform min dynamic activity    Time  6    Period  Weeks    Status  New    Target Date  04/09/18            Plan - 03/01/18 1249    Clinical Impression Statement  Pt arrives reporting improved knee bending with improved ability to climb stairs slowly and get in and out of her car. She still lacks full knee extension. MT used to improved knee extension with PROM and patella mobs. Reviewed initial HEP and began recumbent bike. She returns to work Monday and has access to rec bike.     PT Next Visit Plan  check HEP, manual, ROM , rec.  bike     PT Home Exercise Plan  quad set, SLR/VMO, hamstring, ITB and gastroc.     Consulted and Agree  with Plan of Care  Patient       Patient will benefit from skilled therapeutic intervention in order to improve the following deficits and impairments:  Abnormal gait, Decreased balance, Decreased mobility, Hypomobility, Pain, Impaired flexibility, Increased fascial restricitons, Decreased strength, Decreased range of motion, Decreased scar mobility, Increased edema  Visit Diagnosis: Stiffness of left knee, not elsewhere classified  Difficulty in walking, not elsewhere classified  Localized edema  Acute pain of left knee     Problem List Patient Active Problem List   Diagnosis Date Noted  . Problems with swallowing and mastication   . Globus hystericus   . Gastritis   . Hiatal hernia     Sherrie MustacheDonoho, Jessica McGee, PTA 03/01/2018, 12:53 PM  [facility] [address] [place], [place], [address] Phone: [phone]   Fax:  [phone]  Name: Erin Lucero MRN: [id number] Date of Birth: November 26, 1960

## 2018-03-04 ENCOUNTER — Ambulatory Visit: Payer: 59 | Admitting: Physical Therapy

## 2018-03-05 ENCOUNTER — Ambulatory Visit: Payer: 59 | Admitting: Physical Therapy

## 2018-03-05 ENCOUNTER — Encounter: Payer: Self-pay | Admitting: Physical Therapy

## 2018-03-05 DIAGNOSIS — R262 Difficulty in walking, not elsewhere classified: Secondary | ICD-10-CM

## 2018-03-05 DIAGNOSIS — M6281 Muscle weakness (generalized): Secondary | ICD-10-CM | POA: Diagnosis not present

## 2018-03-05 DIAGNOSIS — M25511 Pain in right shoulder: Secondary | ICD-10-CM | POA: Diagnosis not present

## 2018-03-05 DIAGNOSIS — M25611 Stiffness of right shoulder, not elsewhere classified: Secondary | ICD-10-CM

## 2018-03-05 DIAGNOSIS — M25662 Stiffness of left knee, not elsewhere classified: Secondary | ICD-10-CM

## 2018-03-05 DIAGNOSIS — R600 Localized edema: Secondary | ICD-10-CM | POA: Diagnosis not present

## 2018-03-05 DIAGNOSIS — M25562 Pain in left knee: Secondary | ICD-10-CM | POA: Diagnosis not present

## 2018-03-05 NOTE — Therapy (Signed)
[facility] [address] [place], [place], [address] Phone: [phone]   Fax:  [phone]  Physical Therapy Treatment  Patient Details  Name: ELLEEN OSTERKAMP MRN: [id number] Date of Birth: 04-09-1960 Referring Provider (PT): Dr. Sherlean Foot    Encounter Date: 03/05/2018  PT End of Session - 03/05/18 1026    Visit Number  3    Number of Visits  12    Date for PT Re-Evaluation  04/09/18    PT Start Time  0935    PT Stop Time  1015    PT Time Calculation (min)  40 min    Activity Tolerance  Patient tolerated treatment well    Behavior During Therapy  [facility] for tasks assessed/performed       Past Medical History:  Diagnosis Date  . Arthritis    right great toe  . Esophageal spasm   . GERD (gastroesophageal reflux disease)   . Wears contact lenses     Past Surgical History:  Procedure Laterality Date  . AUGMENTATION MAMMAPLASTY Bilateral 2001   retro pec saline  . BREAST ENHANCEMENT SURGERY  2005  . COLONOSCOPY    . ESOPHAGOGASTRODUODENOSCOPY (EGD) WITH PROPOFOL N/A 08/30/2015   Procedure: ESOPHAGOGASTRODUODENOSCOPY (EGD) WITH PROPOFOL;  Surgeon: Midge Minium, MD;  Location: [facility];  Service: Endoscopy;  Laterality: N/A;  . SHOULDER ARTHROSCOPY WITH SUBACROMIAL DECOMPRESSION, ROTATOR CUFF REPAIR AND BICEP TENDON REPAIR  02/02/2012   Procedure: SHOULDER ARTHROSCOPY WITH SUBACROMIAL DECOMPRESSION, ROTATOR CUFF REPAIR AND BICEP TENDON REPAIR;  Surgeon: Mable Paris, MD;  [facility];  Service: Orthopedics;  Laterality: Left;  Possible Bicep Tendondesis, open tenodesis    There were no vitals filed for this visit.  Subjective Assessment - 03/05/18 0945    Subjective  Pt states that she was a little sore after last treatment and states her knee is popping with no pain. Pt has been attempting descending stairs, but is still having difficulties with pain.     Currently in Pain?  No/denies                        [facility] Adult PT Treatment/Exercise - 03/05/18 0001      Knee/Hip Exercises: Aerobic   Recumbent Bike  3 min Level 1       Knee/Hip Exercises: Standing   Side Lunges  20 reps    Side Lunges Limitations  Red TB      Knee/Hip Exercises: Seated   Long Arc Quad  Left;1 set;10 reps    Hamstring Curl  Left;15 reps    Hamstring Limitations  Red TB      Knee/Hip Exercises: Supine   Quad Sets  20 reps    Quad Sets Limitations  with and without towel    Terminal Knee Extension  15 reps    Theraband Level (Terminal Knee Extension)  Level 2 (Red)      Manual Therapy   Manual Therapy  Joint mobilization    Manual therapy comments  Noted pain medially when laterally mobillizing     Joint Mobilization  Patella             PT Education - 03/05/18 1024    Education Details  Added side lunging to promote quad/hamstring strength and standing gastroc stretch to promote heel strike.     Person(s) Educated  Patient    Methods  Explanation;Demonstration;Tactile cues    Comprehension  Verbalized understanding;Returned demonstration  PT Long Term Goals - 02/12/18 1208      PT LONG TERM GOAL #1   Title  Patient will be I with HEP for hip and knee ROM, strength     Time  6    Period  Weeks    Status  New    Target Date  04/09/18      PT LONG TERM GOAL #2   Title  Pt will be able to extend knee to no more than 5 deg lacking extension on L for normal gait mechanics     Time  6    Period  Weeks    Status  New    Target Date  04/09/18      PT LONG TERM GOAL #3   Title  FOTO score will improve to less than 45% impaired to demo functional improvement     Time  6    Period  Weeks    Status  New    Target Date  04/09/18      PT LONG TERM GOAL #4   Title  Pt will be able to squat and lunge without  pain in L knee, controlled range of motion     Time  6    Period  Weeks    Status  New    Target Date  04/09/18      PT LONG TERM GOAL #5    Title  Pt will be able to stand on LLE for 30 sec and perform min dynamic activity    Time  6    Period  Weeks    Status  New    Target Date  04/09/18            Plan - 03/05/18 1028    Clinical Impression Statement  Pt denies pain at beginning of treatment. During terminal knee extension and LAQs, pt c/o medial/posterior knee pain. Patellar mobs were used to decrease tenderness in medial knee joint. Added side lunges and hamstring curls with red TB to improve quad/hamstring strength, as well as knee stabilization. Pt demos mild left knee flexion when observed in standing. Pt is steadily progressing towards goals.     PT Treatment/Interventions  ADLs/Self Care Home Management;Cryotherapy;Electrical Stimulation;Moist Heat;Functional mobility training;Therapeutic activities;Orthotic Fit/Training;Ultrasound;Gait training;Therapeutic exercise;Stair training;Balance training;Neuromuscular re-education;Taping;Scar mobilization;Manual techniques;Passive range of motion;Vasopneumatic Device;Patient/family education    PT Next Visit Plan  check HEP, manual, ROM , rec.  bike, add heel raises, add handout of side lunge with red TB    PT Home Exercise Plan  quad set, SLR/VMO, hamstring, ITB and gastroc,    Consulted and Agree with Plan of Care  Patient      During this treatment session, the therapist was present, participating in and directing the treatment.  Patient will benefit from skilled therapeutic intervention in order to improve the following deficits and impairments:  Abnormal gait, Decreased balance, Decreased mobility, Hypomobility, Pain, Impaired flexibility, Increased fascial restricitons, Decreased strength, Decreased range of motion, Decreased scar mobility, Increased edema  Visit Diagnosis: Stiffness of left knee, not elsewhere classified  Difficulty in walking, not elsewhere classified  Localized edema  Acute pain of left knee  Stiffness of right shoulder, not elsewhere  classified  Right shoulder pain, unspecified chronicity  Muscle weakness (generalized)     Problem List Patient Active Problem List   Diagnosis Date Noted  . Problems with swallowing and mastication   . Globus hystericus   . Gastritis   . Hiatal hernia  Royetta AsalHeather Luismiguel Lamere, SPTA 03/05/2018, 10:57 AM   Jannette SpannerJessica Donoho, PTA 03/05/18 11:06 AM Phone: [phone] Fax: [phone]  [facility] [address] [place], [place], [address] Phone: [phone]   Fax:  [phone]  Name: Wille GlaserCarol K Battie MRN: [id number] Date of Birth: 1960/05/01

## 2018-03-09 ENCOUNTER — Encounter: Payer: Self-pay | Admitting: Physical Therapy

## 2018-03-10 ENCOUNTER — Encounter: Payer: Self-pay | Admitting: Physical Therapy

## 2018-03-12 ENCOUNTER — Encounter: Payer: Self-pay | Admitting: Physical Therapy

## 2018-03-12 ENCOUNTER — Ambulatory Visit: Payer: 59 | Admitting: Physical Therapy

## 2018-03-12 DIAGNOSIS — M25662 Stiffness of left knee, not elsewhere classified: Secondary | ICD-10-CM | POA: Diagnosis not present

## 2018-03-12 DIAGNOSIS — M25611 Stiffness of right shoulder, not elsewhere classified: Secondary | ICD-10-CM | POA: Diagnosis not present

## 2018-03-12 DIAGNOSIS — R262 Difficulty in walking, not elsewhere classified: Secondary | ICD-10-CM | POA: Diagnosis not present

## 2018-03-12 DIAGNOSIS — R600 Localized edema: Secondary | ICD-10-CM

## 2018-03-12 DIAGNOSIS — M6281 Muscle weakness (generalized): Secondary | ICD-10-CM

## 2018-03-12 DIAGNOSIS — M25562 Pain in left knee: Secondary | ICD-10-CM

## 2018-03-12 DIAGNOSIS — M25511 Pain in right shoulder: Secondary | ICD-10-CM | POA: Diagnosis not present

## 2018-03-12 NOTE — Therapy (Signed)
[facility] [address] [place], [place], [address] Phone: [phone]   Fax:  [phone]  Physical Therapy Treatment  Patient Details  Name: Erin GlaserCarol K Lucero MRN: [id number] Date of Birth: Dec 02, 1960 Referring Provider (PT): Dr. Sherlean FootLucey    Encounter Date: 03/12/2018  PT End of Session - 03/12/18 0807    Visit Number  4    Number of Visits  12    Date for PT Re-Evaluation  04/09/18    PT Start Time  0717    PT Stop Time  0803    PT Time Calculation (min)  46 min    Activity Tolerance  Patient tolerated treatment well    Behavior During Therapy  [facility] for tasks assessed/performed       Past Medical History:  Diagnosis Date  . Arthritis    right great toe  . Esophageal spasm   . GERD (gastroesophageal reflux disease)   . Wears contact lenses     Past Surgical History:  Procedure Laterality Date  . AUGMENTATION MAMMAPLASTY Bilateral 2001   retro pec saline  . BREAST ENHANCEMENT SURGERY  2005  . COLONOSCOPY    . ESOPHAGOGASTRODUODENOSCOPY (EGD) WITH PROPOFOL N/A 08/30/2015   Procedure: ESOPHAGOGASTRODUODENOSCOPY (EGD) WITH PROPOFOL;  Surgeon: Midge Miniumarren Wohl, MD;  [facility];  Service: Endoscopy;  Laterality: N/A;  . SHOULDER ARTHROSCOPY WITH SUBACROMIAL DECOMPRESSION, ROTATOR CUFF REPAIR AND BICEP TENDON REPAIR  02/02/2012   Procedure: SHOULDER ARTHROSCOPY WITH SUBACROMIAL DECOMPRESSION, ROTATOR CUFF REPAIR AND BICEP TENDON REPAIR;  Surgeon: Mable ParisJustin William Chandler, MD;  Location:  S[facility]ERY [facility];  Service: Orthopedics;  Laterality: Left;  Possible Bicep Tendondesis, open tenodesis    There were no vitals filed for this visit.  Subjective Assessment - 03/12/18 0723    Subjective  "Don't think my knee is strong enough to go down stairs. I was fine after last tx. I still have pain in my knee though. When I have to sit a long time at work, my knee gets really stiff."    Currently in Pain?  No/denies                       [facility]/Exercise - 03/12/18 0001      Knee/Hip Exercises: Stretches   Gastroc Stretch  Right;2 reps;20 seconds      Knee/Hip Exercises: Aerobic   Recumbent Bike  3 min Level 2      Knee/Hip Exercises: Standing   Forward Lunges  2 sets;10 reps;Both    Side Lunges  20 reps    Side Lunges Limitations  Red TB    Lateral Step Up  Right;1 set;15 reps;Step Height: 2"   Had to stop d/t pain   Other Standing Knee Exercises  Chair pose x3 hold 30 sec      Knee/Hip Exercises: Seated   Hamstring Curl  Left;2 sets;10 reps    Hamstring Limitations  Red TB      Knee/Hip Exercises: Supine   Straight Leg Raise with External Rotation  2 sets;10 reps;Strengthening;Left      Manual Therapy   Manual Therapy  Soft tissue mobilization    Manual therapy comments  IASTM to lateral lt knee                  PT Long Term Goals - 02/12/18 1208      PT LONG TERM GOAL #1   Title  Patient will be I with HEP for hip  and knee ROM, strength     Time  6    Period  Weeks    Status  New    Target Date  04/09/18      PT LONG TERM GOAL #2   Title  Pt will be able to extend knee to no more than 5 deg lacking extension on L for normal gait mechanics     Time  6    Period  Weeks    Status  New    Target Date  04/09/18      PT LONG TERM GOAL #3   Title  FOTO score will improve to less than 45% impaired to demo functional improvement     Time  6    Period  Weeks    Status  New    Target Date  04/09/18      PT LONG TERM GOAL #4   Title  Pt will be able to squat and lunge without  pain in L knee, controlled range of motion     Time  6    Period  Weeks    Status  New    Target Date  04/09/18      PT LONG TERM GOAL #5   Title  Pt will be able to stand on LLE for 30 sec and perform min dynamic activity    Time  6    Period  Weeks    Status  New    Target Date  04/09/18            Plan - 03/12/18 [id number]    Clinical Impression  Statement  Pt denies pain at beginning of treatment, but c/o of still having difficulties with descending stairs. Noted pt had pain during close chain exercises in the right medial knee and left knee as flexion increases. Attempted lateral step ups, but pt c/o increased pain. Added static lunges and chair pose. Overall, pt tolerated tx well and is progressing towards goals.     PT Frequency  2x / week    PT Duration  6 weeks    PT Treatment/Interventions  ADLs/Self Care Home Management;Cryotherapy;Electrical Stimulation;Moist Heat;Functional mobility training;Therapeutic activities;Orthotic Fit/Training;Ultrasound;Gait training;Therapeutic exercise;Stair training;Balance training;Neuromuscular re-education;Taping;Scar mobilization;Manual techniques;Passive range of motion;Vasopneumatic Device;Patient/family education    PT Next Visit Plan  check HEP, manual, ROM , rec.  bike, add heel raises, focus on eccentric control of quads before adding step ups    PT Home Exercise Plan  quad set, SLR/VMO, hamstring, ITB and gastroc,    Consulted and Agree with Plan of Care  Patient       During this treatment session, the therapist was present, participating in and directing the treatment.  Patient will benefit from skilled therapeutic intervention in order to improve the following deficits and impairments:  Abnormal gait, Decreased balance, Decreased mobility, Hypomobility, Pain, Impaired flexibility, Increased fascial restricitons, Decreased strength, Decreased range of motion, Decreased scar mobility, Increased edema  Visit Diagnosis: Stiffness of left knee, not elsewhere classified  Difficulty in walking, not elsewhere classified  Localized edema  Acute pain of left knee  Muscle weakness (generalized)     Problem List Patient Active Problem List   Diagnosis Date Noted  . Problems with swallowing and mastication   . Globus hystericus   . Gastritis   . Hiatal hernia     Royetta AsalHeather Mamadou Breon,  SPTA 03/12/2018, 8:15 AM   Jannette SpannerJessica Donoho, PTA 03/12/18 10:42 AM Phone: [phone] Fax: [phone]  [facility] [address]  [address] [place], [place], [address] Phone: [phone]   Fax:  [phone]  Name: MORIYAH DELIN MRN: [id number] Date of Birth: 11-01-1960

## 2018-03-15 ENCOUNTER — Ambulatory Visit: Payer: 59 | Admitting: Physical Therapy

## 2018-03-15 ENCOUNTER — Encounter: Payer: Self-pay | Admitting: Physical Therapy

## 2018-03-15 DIAGNOSIS — M25662 Stiffness of left knee, not elsewhere classified: Secondary | ICD-10-CM

## 2018-03-15 DIAGNOSIS — M25511 Pain in right shoulder: Secondary | ICD-10-CM | POA: Diagnosis not present

## 2018-03-15 DIAGNOSIS — R262 Difficulty in walking, not elsewhere classified: Secondary | ICD-10-CM

## 2018-03-15 DIAGNOSIS — R600 Localized edema: Secondary | ICD-10-CM | POA: Diagnosis not present

## 2018-03-15 DIAGNOSIS — M25611 Stiffness of right shoulder, not elsewhere classified: Secondary | ICD-10-CM | POA: Diagnosis not present

## 2018-03-15 DIAGNOSIS — M25562 Pain in left knee: Secondary | ICD-10-CM

## 2018-03-15 DIAGNOSIS — M6281 Muscle weakness (generalized): Secondary | ICD-10-CM

## 2018-03-15 NOTE — Therapy (Signed)
[facility] [address] [place], [place], [address] Phone: [phone]   Fax:  [phone]  Physical Therapy Treatment  Patient Details  Name: Erin Lucero MRN: [id number] Date of Birth: 1960/06/27 Referring Provider (PT): Dr. Sherlean FootLucey    Encounter Date: 03/15/2018  PT End of Session - 03/15/18 0850    Visit Number  5    Number of Visits  12    Date for PT Re-Evaluation  04/09/18    PT Start Time  0803    PT Stop Time  0850    PT Time Calculation (min)  47 min    Activity Tolerance  Patient tolerated treatment well    Behavior During Therapy  [facility] for tasks assessed/performed       Past Medical History:  Diagnosis Date  . Arthritis    right great toe  . Esophageal spasm   . GERD (gastroesophageal reflux disease)   . Wears contact lenses     Past Surgical History:  Procedure Laterality Date  . AUGMENTATION MAMMAPLASTY Bilateral 2001   retro pec saline  . BREAST ENHANCEMENT SURGERY  2005  . COLONOSCOPY    . ESOPHAGOGASTRODUODENOSCOPY (EGD) WITH PROPOFOL N/A 08/30/2015   Procedure: ESOPHAGOGASTRODUODENOSCOPY (EGD) WITH PROPOFOL;  Surgeon: Midge Miniumarren Wohl, MD;  [facility];  Service: Endoscopy;  Laterality: N/A;  . SHOULDER ARTHROSCOPY WITH SUBACROMIAL DECOMPRESSION, ROTATOR CUFF REPAIR AND BICEP TENDON REPAIR  02/02/2012   Procedure: SHOULDER ARTHROSCOPY WITH SUBACROMIAL DECOMPRESSION, ROTATOR CUFF REPAIR AND BICEP TENDON REPAIR;  Surgeon: Mable ParisJustin William Chandler, MD;  Location: [facility];  Service: Orthopedics;  Laterality: Left;  Possible Bicep Tendondesis, open tenodesis    There were no vitals filed for this visit.  Subjective Assessment - 03/15/18 0805    Subjective  "Went to an [employer] show on Saturday and had to go up and down a lot of stairs. I took one step at a time and my knee locked up after sitting in the car for 3 hours."     Currently in Pain?  No/denies                        [facility] Treatment/Exercise - 03/15/18 0001      Knee/Hip Exercises: Stretches   Active Hamstring Stretch  Left;1 rep;30 seconds      Knee/Hip Exercises: Aerobic   Recumbent Bike  5 min Level 2      Knee/Hip Exercises: Standing   Heel Raises  15 reps;Both    Forward Lunges  2 sets;10 reps;Both    Side Lunges  20 reps    Side Lunges Limitations  Red TB    Lateral Step Up  Step Height: 2";10 reps;2 sets;Hand Hold: 1;Left   Had to stop d/t pain   Forward Step Up  Left;10 reps;Step Height: 2";1 set    Step Down  Left;1 set;10 reps;Step Height: 2";Hand Hold: 1      Knee/Hip Exercises: Seated   Long Arc Quad  10 reps;Left;Weights;2 sets    Long Arc Quad Weight  2 lbs.    Hamstring Curl  Left;2 sets;10 reps    Hamstring Limitations  green tb      Knee/Hip Exercises: Supine   Straight Leg Raise with External Rotation  10 reps;Strengthening;Left;1 set    Straight Leg Raise with External Rotation Limitations  #2                  PT Long  Term Goals - 02/12/18 1208      PT LONG TERM GOAL #1   Title  Patient will be I with HEP for hip and knee ROM, strength     Time  6    Period  Weeks    Status  New    Target Date  04/09/18      PT LONG TERM GOAL #2   Title  Pt will be able to extend knee to no more than 5 deg lacking extension on L for normal gait mechanics     Time  6    Period  Weeks    Status  New    Target Date  04/09/18      PT LONG TERM GOAL #3   Title  FOTO score will improve to less than 45% impaired to demo functional improvement     Time  6    Period  Weeks    Status  New    Target Date  04/09/18      PT LONG TERM GOAL #4   Title  Pt will be able to squat and lunge without  pain in L knee, controlled range of motion     Time  6    Period  Weeks    Status  New    Target Date  04/09/18      PT LONG TERM GOAL #5   Title  Pt will be able to stand on LLE for 30 sec and perform min dynamic activity    Time  6     Period  Weeks    Status  New    Target Date  04/09/18            Plan - 03/15/18 0940    Clinical Impression Statement  Pt denies pain at beginning of tx, but reports having to navigate lots of stairs this weekend. Noted pt had pain during step downs and no pain with lateral step ups today.  Provided STM to anterolateral left knee since pt stated that it helped last time and has continued sensitivity to the proximal tibialis anterior/peroneal area. Therapist performed ankle DF MMT that was 4+bilaterally and pain in knee was not reproduced.  Pt stated that the lateral lunges and lateral step ups were getting easier and she thinks she is getting stronger. Pt tolerated tx well and is progressing towards goals.     PT Frequency  2x / week    PT Duration  6 weeks    PT Treatment/Interventions  ADLs/Self Care Home Management;Cryotherapy;Electrical Stimulation;Moist Heat;Functional mobility training;Therapeutic activities;Orthotic Fit/Training;Ultrasound;Gait training;Therapeutic exercise;Stair training;Balance training;Neuromuscular re-education;Taping;Scar mobilization;Manual techniques;Passive range of motion;Vasopneumatic Device;Patient/family education    PT Next Visit Plan  check HEP, manual, ROM , rec.  bike, add heel raises, begin stair training     PT Home Exercise Plan  quad set, SLR/VMO, hamstring, ITB and gastroc,    Consulted and Agree with Plan of Care  Patient       Patient will benefit from skilled therapeutic intervention in order to improve the following deficits and impairments:  Abnormal gait, Decreased balance, Decreased mobility, Hypomobility, Pain, Impaired flexibility, Increased fascial restricitons, Decreased strength, Decreased range of motion, Decreased scar mobility, Increased edema  Visit Diagnosis: Stiffness of left knee, not elsewhere classified  Difficulty in walking, not elsewhere classified  Localized edema  Acute pain of left knee  Muscle weakness  (generalized)     Problem List Patient Active Problem List   Diagnosis Date Noted  .  Problems with swallowing and mastication   . Globus hystericus   . Gastritis   . Hiatal hernia     Royetta Asal, SPTA 03/15/2018, 11:02 AM  Jannette Spanner, PTA 03/15/18 1:38 PM Phone: [phone] Fax: [phone]  [facility] [address] [place], [place], [address] Phone: [phone]   Fax:  [phone]  Name: Erin Lucero MRN: [id number] Date of Birth: 02-15-1961

## 2018-03-18 ENCOUNTER — Ambulatory Visit: Payer: 59 | Admitting: Physical Therapy

## 2018-03-24 ENCOUNTER — Telehealth: Payer: Self-pay | Admitting: Physical Therapy

## 2018-03-24 ENCOUNTER — Ambulatory Visit: Payer: 59 | Admitting: Physical Therapy

## 2018-03-24 DIAGNOSIS — G4700 Insomnia, unspecified: Secondary | ICD-10-CM | POA: Diagnosis not present

## 2018-03-24 DIAGNOSIS — F411 Generalized anxiety disorder: Secondary | ICD-10-CM | POA: Diagnosis not present

## 2018-03-24 NOTE — Telephone Encounter (Signed)
Pt called after No-showing to her morning appointment to let therapist know that she was not feeling well.

## 2018-03-26 ENCOUNTER — Encounter: Payer: Self-pay | Admitting: Physical Therapy

## 2018-03-26 ENCOUNTER — Ambulatory Visit: Payer: 59 | Admitting: Physical Therapy

## 2018-03-26 DIAGNOSIS — M25562 Pain in left knee: Secondary | ICD-10-CM

## 2018-03-26 DIAGNOSIS — R600 Localized edema: Secondary | ICD-10-CM

## 2018-03-26 DIAGNOSIS — M6281 Muscle weakness (generalized): Secondary | ICD-10-CM | POA: Diagnosis not present

## 2018-03-26 DIAGNOSIS — R262 Difficulty in walking, not elsewhere classified: Secondary | ICD-10-CM | POA: Diagnosis not present

## 2018-03-26 DIAGNOSIS — M25511 Pain in right shoulder: Secondary | ICD-10-CM | POA: Diagnosis not present

## 2018-03-26 DIAGNOSIS — M25662 Stiffness of left knee, not elsewhere classified: Secondary | ICD-10-CM | POA: Diagnosis not present

## 2018-03-26 DIAGNOSIS — M25611 Stiffness of right shoulder, not elsewhere classified: Secondary | ICD-10-CM | POA: Diagnosis not present

## 2018-03-26 NOTE — Therapy (Addendum)
[facility] [address] [place], [place], [address] Phone: [phone]   Fax:  [phone]  Physical Therapy Treatment  Patient Details  Name: Erin Lucero MRN: [id number] Date of Birth: June 05, 1960 Referring Provider (PT): Dr. Sherlean FootLucey    Encounter Date: 03/26/2018  PT End of Session - 03/26/18 0959    Visit Number  6    Number of Visits  12    Date for PT Re-Evaluation  04/09/18    PT Start Time  0804    PT Stop Time  0858    PT Time Calculation (min)  54 min    Activity Tolerance  Patient tolerated treatment well    Behavior During Therapy  [facility] for tasks assessed/performed       Past Medical History:  Diagnosis Date  . Arthritis    right great toe  . Esophageal spasm   . GERD (gastroesophageal reflux disease)   . Wears contact lenses     Past Surgical History:  Procedure Laterality Date  . AUGMENTATION MAMMAPLASTY Bilateral 2001   retro pec saline  . BREAST ENHANCEMENT SURGERY  2005  . COLONOSCOPY    . ESOPHAGOGASTRODUODENOSCOPY (EGD) WITH PROPOFOL N/A 08/30/2015   Procedure: ESOPHAGOGASTRODUODENOSCOPY (EGD) WITH PROPOFOL;  Surgeon: Midge Miniumarren Wohl, MD;  [facility];  Service: Endoscopy;  Laterality: N/A;  . SHOULDER ARTHROSCOPY WITH SUBACROMIAL DECOMPRESSION, ROTATOR CUFF REPAIR AND BICEP TENDON REPAIR  02/02/2012   Procedure: SHOULDER ARTHROSCOPY WITH SUBACROMIAL DECOMPRESSION, ROTATOR CUFF REPAIR AND BICEP TENDON REPAIR;  Surgeon: Mable ParisJustin William Chandler, MD;  Location: [facility];  Service: Orthopedics;  Laterality: Left;  Possible Bicep Tendondesis, open tenodesis    There were no vitals filed for this visit.  Subjective Assessment - 03/26/18 0806    Subjective  "After sitting for a long period of time, I still have to move my knee slowly to get it warmed up."    Currently in Pain?  No/denies                       [facility] Treatment/Exercise - 03/26/18 0001       Ambulation/Gait   Stairs  Yes    Stairs Assistance  5: Supervision   Hand hold 1 when descending   Stairs Assistance Details (indicate cue type and reason)  Cues for eccentric control during descent    Stair Management Technique  One rail Right    Number of Stairs  32    Height of Stairs  --   2" and 4"     Knee/Hip Exercises: Aerobic   Recumbent Bike  5 min Level 4      Knee/Hip Exercises: Standing   Stairs  Forward step ups 2 sets x10 on 4"; Lateral step ups 2 sets x10 on 2"    Cues for eccentric control and foot placement   Other Standing Knee Exercises  Toe taps forward and modified lateral; 10 reps each balace on LLE   Cues slight knee flexion   Other Standing Knee Exercises  3 way hip both sides x10   Cues for slight knee flexion     Modalities   Modalities  Ultrasound      Ultrasound   Ultrasound Location  Medial and Lateral Left Knee    Ultrasound Parameters  3 MHz, 0.9 w/cm2, 50% DC    Ultrasound Goals  Pain      Manual Therapy   Manual Therapy  Soft tissue mobilization  Soft tissue mobilization  IASTM to lateral left knee to reduce pain and muscle tightness                  PT Long Term Goals - 02/12/18 1208      PT LONG TERM GOAL #1   Title  Patient will be I with HEP for hip and knee ROM, strength     Time  6    Period  Weeks    Status  New    Target Date  04/09/18      PT LONG TERM GOAL #2   Title  Pt will be able to extend knee to no more than 5 deg lacking extension on L for normal gait mechanics     Time  6    Period  Weeks    Status  New    Target Date  04/09/18      PT LONG TERM GOAL #3   Title  FOTO score will improve to less than 45% impaired to demo functional improvement     Time  6    Period  Weeks    Status  New    Target Date  04/09/18      PT LONG TERM GOAL #4   Title  Pt will be able to squat and lunge without  pain in L knee, controlled range of motion     Time  6    Period  Weeks    Status  New    Target  Date  04/09/18      PT LONG TERM GOAL #5   Title  Pt will be able to stand on LLE for 30 sec and perform min dynamic activity    Time  6    Period  Weeks    Status  New    Target Date  04/09/18            Plan - 03/26/18 1000    Clinical Impression Statement  Pt denies pain today, but reports still having trouble sitting for prolonged periods >30 minutes, so therapist educated pt to move more frequently when sitting to promote synovial fluid movement and prevent stiffness. Pt performed some stair training today with c/o pain during the lateral step ups on 2" steps. Pt still c/o pain muscle soreness on lateral aspect of left knee and pain on the medial side of knee. Performed ultrasound to decrease pain and increase tissue healing. Pt requested IASTM to the lateral knee and reports feeling better after tx. See flowsheeet for additional exercises.     PT Next Visit Plan  check HEP, manual, ROM , rec.  bike, add heel raises, ultrasound to medial knee and manual to lateral and cont hip exercises    PT Home Exercise Plan  quad set, SLR/VMO, hamstring, ITB and gastroc,    Consulted and Agree with Plan of Care  Patient       Patient will benefit from skilled therapeutic intervention in order to improve the following deficits and impairments:     Visit Diagnosis: Stiffness of left knee, not elsewhere classified  Difficulty in walking, not elsewhere classified  Localized edema  Acute pain of left knee  Muscle weakness (generalized)     Problem List Patient Active Problem List   Diagnosis Date Noted  . Problems with swallowing and mastication   . Globus hystericus   . Gastritis   . Hiatal hernia     Royetta AsalHeather Jovita Lucero, SPTA 03/26/2018, 10:11 AM  Jannette Spanner, PTA 03/29/18 11:32 AM Phone: [phone] Fax: [phone]  [facility] [address] [place], [place], [address] Phone: [phone]   Fax:   [phone]  Name: Erin Lucero MRN: [id number] Date of Birth: 11/19/1960

## 2018-03-29 ENCOUNTER — Ambulatory Visit: Payer: 59 | Attending: Orthopedic Surgery | Admitting: Physical Therapy

## 2018-03-29 DIAGNOSIS — R262 Difficulty in walking, not elsewhere classified: Secondary | ICD-10-CM | POA: Diagnosis not present

## 2018-03-29 DIAGNOSIS — R600 Localized edema: Secondary | ICD-10-CM | POA: Insufficient documentation

## 2018-03-29 DIAGNOSIS — M6281 Muscle weakness (generalized): Secondary | ICD-10-CM | POA: Insufficient documentation

## 2018-03-29 DIAGNOSIS — M25562 Pain in left knee: Secondary | ICD-10-CM | POA: Diagnosis not present

## 2018-03-29 DIAGNOSIS — M25662 Stiffness of left knee, not elsewhere classified: Secondary | ICD-10-CM | POA: Insufficient documentation

## 2018-03-29 NOTE — Addendum Note (Signed)
Addended by: Karie Mainland L on: 03/29/2018 02:28 PM   Modules accepted: Orders

## 2018-03-29 NOTE — Therapy (Signed)
[facility] [address] [place], [place], [address] Phone: [phone]   Fax:  [phone]  Physical Therapy Treatment  Patient Details  Name: Erin Lucero MRN: [id number] Date of Birth: 07-16-1960 Referring Provider (PT): Dr. Sherlean FootLucey    Encounter Date: 03/29/2018  PT End of Session - 03/29/18 0942    Visit Number  7    Number of Visits  12    Date for PT Re-Evaluation  04/09/18    PT Start Time  0815    PT Stop Time  0848    PT Time Calculation (min)  33 min    Activity Tolerance  Patient tolerated treatment well    Behavior During Therapy  [facility] for tasks assessed/performed       Past Medical History:  Diagnosis Date  . Arthritis    right great toe  . Esophageal spasm   . GERD (gastroesophageal reflux disease)   . Wears contact lenses     Past Surgical History:  Procedure Laterality Date  . AUGMENTATION MAMMAPLASTY Bilateral 2001   retro pec saline  . BREAST ENHANCEMENT SURGERY  2005  . COLONOSCOPY    . ESOPHAGOGASTRODUODENOSCOPY (EGD) WITH PROPOFOL N/A 08/30/2015   Procedure: ESOPHAGOGASTRODUODENOSCOPY (EGD) WITH PROPOFOL;  Surgeon: Midge Miniumarren Wohl, MD;  [facility];  Service: Endoscopy;  Laterality: N/A;  . SHOULDER ARTHROSCOPY WITH SUBACROMIAL DECOMPRESSION, ROTATOR CUFF REPAIR AND BICEP TENDON REPAIR  02/02/2012   Procedure: SHOULDER ARTHROSCOPY WITH SUBACROMIAL DECOMPRESSION, ROTATOR CUFF REPAIR AND BICEP TENDON REPAIR;  Surgeon: Mable ParisJustin William Chandler, MD;  Location: [facility];  Service: Orthopedics;  Laterality: Left;  Possible Bicep Tendondesis, open tenodesis    There were no vitals filed for this visit.  Subjective Assessment - 03/29/18 0832    Subjective  It is still stiff when I go to stand from sitting. Pain in lower leg sore to the touch.  Moderate when I go down the stairs     Currently in Pain?  No/denies    Pain Orientation  Left    Pain Descriptors / Indicators   Aching;Sore    Pain Onset  More than a month ago    Pain Frequency  Several days a week         [facility] Assessment - 03/29/18 0001      Observation/Other Assessments   Focus on Therapeutic Outcomes (FOTO)   50% limited , improved from 68 %  limited      AROM   Left Knee Extension  -8    Left Knee Flexion  136      Strength   Left Knee Flexion  5/5    Left Knee Extension  5/5           [facility] Adult PT Treatment/Exercise - 03/29/18 0001      Knee/Hip Exercises: Aerobic   Recumbent Bike  5 min Level 4      Ultrasound   Ultrasound Location  Medial    Ultrasound Parameters  3 Mhz, 1.0 w/cm2, Pulsed    Ultrasound Goals  Pain      Manual Therapy   Joint Mobilization  Patellar Mobs   Pain on inferior glide; stiff on anterior/medial glide   Soft tissue mobilization  Peroneals to reduce stiffness             PT Education - 03/29/18 1318    Education provided  Yes    Education Details  renewal, POC ,ionto     Person(s) Educated  Patient    Methods  Explanation    Comprehension  Verbalized understanding          PT Long Term Goals - 03/29/18 0817      PT LONG TERM GOAL #1   Title  Patient will be I with HEP for hip and knee ROM, strength     Status  On-going    Target Date  05/10/18      PT LONG TERM GOAL #2   Title  Pt will be able to extend knee to no more than 5 deg lacking extension on L for normal gait mechanics     Status  On-going    Target Date  05/10/18      PT LONG TERM GOAL #3   Title  FOTO score will improve to less than 45% impaired to demo functional improvement     Baseline  50%    Status  On-going    Target Date  05/10/18      PT LONG TERM GOAL #4   Title  Pt will be able to squat and lunge without  pain in L knee, controlled range of motion     Status  Achieved    Target Date  05/10/18      PT LONG TERM GOAL #5   Title  Pt will be able to stand on LLE for 30 sec and perform min dynamic activity    Status  On-going    Target  Date  05/10/18      PT LONG TERM GOAL #6   Title  Pt will be able to descend 12+ steps holding L rail with confident, <2/10 pain most of the time.      Time  6    Period  Weeks    Status  New    Target Date  05/10/18            Plan - 03/29/18 16100828    Clinical Impression Statement  Patient seen by Karie MainlandJennifer Paa, PT for portion of session for re-evaluation.  She is doing well overall, does HEP consistently.  Soreness in L lower proximal leg surrounding peroneal tendons and anterior tibialis.  Has hadtimes when she needs to stand and wait otherwise her knee feels like it will give out.  She will benefit from continued PT for maximal safety, eccentric control and to normalize ROM.  Will add in iontophoresis for localized inflammation.  Plans to make a MD appt today for follow up.     PT Frequency  2x / week    PT Duration  6 weeks    PT Treatment/Interventions  ADLs/Self Care Home Management;Cryotherapy;Electrical Stimulation;Moist Heat;Functional mobility training;Therapeutic activities;Orthotic Fit/Training;Ultrasound;Gait training;Therapeutic exercise;Stair training;Balance training;Neuromuscular re-education;Taping;Scar mobilization;Manual techniques;Passive range of motion;Vasopneumatic Device;Patient/family education;Iontophoresis 4mg /ml Dexamethasone    PT Next Visit Plan  check HEP, manual, ROM , rec.  bike, add heel raises, ultrasound to medial knee and manual to lateral and cont hip exercises    PT Home Exercise Plan  quad set, SLR/VMO, hamstring, ITB and gastroc,    Consulted and Agree with Plan of Care  Patient       Patient will benefit from skilled therapeutic intervention in order to improve the following deficits and impairments:  Abnormal gait, Decreased balance, Decreased mobility, Hypomobility, Pain, Impaired flexibility, Increased fascial restricitons, Decreased strength, Decreased range of motion, Decreased scar mobility, Increased edema  Visit Diagnosis: Stiffness of  left knee, not elsewhere classified  Difficulty in walking, not elsewhere classified  Localized edema  Acute pain of left knee  Muscle weakness (generalized)     Problem List Patient Active Problem List   Diagnosis Date Noted  . Problems with swallowing and mastication   . Globus hystericus   . Gastritis   . Hiatal hernia     PAA,JENNIFER , SPTA 03/29/2018, 1:23 PM  [facility] [address] [place], [place], [address] Phone: [phone]   Fax:  [phone]  Name: Erin Lucero MRN: [id number] Date of Birth: 1961/02/03 Karie Mainland, PT 03/29/18 1:23 PM Phone: [phone] Fax: [phone]

## 2018-03-31 ENCOUNTER — Encounter: Payer: Self-pay | Admitting: Physical Therapy

## 2018-03-31 ENCOUNTER — Ambulatory Visit: Payer: 59 | Admitting: Physical Therapy

## 2018-03-31 DIAGNOSIS — M6281 Muscle weakness (generalized): Secondary | ICD-10-CM

## 2018-03-31 DIAGNOSIS — M25662 Stiffness of left knee, not elsewhere classified: Secondary | ICD-10-CM

## 2018-03-31 DIAGNOSIS — R262 Difficulty in walking, not elsewhere classified: Secondary | ICD-10-CM

## 2018-03-31 DIAGNOSIS — M25562 Pain in left knee: Secondary | ICD-10-CM | POA: Diagnosis not present

## 2018-03-31 DIAGNOSIS — R600 Localized edema: Secondary | ICD-10-CM

## 2018-03-31 NOTE — Therapy (Signed)
[facility] [address] [place], [place], [address] Phone: [phone]   Fax:  [phone]  Physical Therapy Evaluation  Patient Details  Name: Erin Lucero MRN: [id number] Date of Birth: [birth year] Referring Provider (PT): Dr. Sherlean FootLucey    Encounter Date: 03/31/2018  PT End of Session - 03/31/18 1019    Visit Number  8    Number of Visits  12    Date for PT Re-Evaluation  04/09/18    PT Start Time  0808    PT Stop Time  0846    PT Time Calculation (min)  38 min    Activity Tolerance  Patient tolerated treatment well    Behavior During Therapy  [facility] for tasks assessed/performed       Past Medical History:  Diagnosis Date  . Arthritis    right great toe  . Esophageal spasm   . GERD (gastroesophageal reflux disease)   . Wears contact lenses     Past Surgical History:  Procedure Laterality Date  . AUGMENTATION MAMMAPLASTY Bilateral 2001   retro pec saline  . BREAST ENHANCEMENT SURGERY  2005  . COLONOSCOPY    . ESOPHAGOGASTRODUODENOSCOPY (EGD) WITH PROPOFOL N/A 08/30/2015   Procedure: ESOPHAGOGASTRODUODENOSCOPY (EGD) WITH PROPOFOL;  Surgeon: Midge Miniumarren Wohl, MD;  [facility];  Service: Endoscopy;  Laterality: N/A;  . SHOULDER ARTHROSCOPY WITH SUBACROMIAL DECOMPRESSION, ROTATOR CUFF REPAIR AND BICEP TENDON REPAIR  02/02/2012   Procedure: SHOULDER ARTHROSCOPY WITH SUBACROMIAL DECOMPRESSION, ROTATOR CUFF REPAIR AND BICEP TENDON REPAIR;  Surgeon: Mable ParisJustin William Chandler, MD;  Location: [facility];  Service: Orthopedics;  Laterality: Left;  Possible Bicep Tendondesis, open tenodesis    There were no vitals filed for this visit.   Subjective Assessment - 03/31/18 0813    Subjective  "I was doing those patellar mobs at home and can feel how tight it is and there's just a tiny bit of pain going down stairs now. Going up stairs is getting better and better."     Currently in Pain?  No/denies                     Objective measurements completed on examination: See above findings.      [facility] Adult PT Treatment/Exercise - 03/31/18 0001      Knee/Hip Exercises: Stretches   Active Hamstring Stretch  2 reps;30 seconds   VMO stretch     Knee/Hip Exercises: Aerobic   Recumbent Bike  6 min Level 4      Knee/Hip Exercises: Supine   Terminal Knee Extension  Left;2 sets;10 reps   Towel under ankle   Other Supine Knee/Hip Exercises  Ankle eversion with red band x20      Modalities   Modalities  Iontophoresis      Iontophoresis   Type of Iontophoresis  Dexamethasone    Location  Left lateral knee    Dose  1 ml    Time  6 hours      Manual Therapy   Manual Therapy  Joint mobilization   PT provided grade V    Joint Mobilization  Patellar Mobs & grade V mobilization by Anne NgKris Leamon, PT   Pain on inferior glide; stiff on anterior/medial glide   Soft tissue mobilization  Peroneals to reduce stiffness and desensitize; MTPR to peroneals and ant. tibialis                  PT Long Term Goals - 03/29/18 [id number]  PT LONG TERM GOAL #1   Title  Patient will be I with HEP for hip and knee ROM, strength     Status  On-going    Target Date  05/10/18      PT LONG TERM GOAL #2   Title  Pt will be able to extend knee to no more than 5 deg lacking extension on L for normal gait mechanics     Status  On-going    Target Date  05/10/18      PT LONG TERM GOAL #3   Title  FOTO score will improve to less than 45% impaired to demo functional improvement     Baseline  50%    Status  On-going    Target Date  05/10/18      PT LONG TERM GOAL #4   Title  Pt will be able to squat and lunge without  pain in L knee, controlled range of motion     Status  Achieved    Target Date  05/10/18      PT LONG TERM GOAL #5   Title  Pt will be able to stand on LLE for 30 sec and perform min dynamic activity    Status  On-going    Target Date  05/10/18      PT LONG TERM GOAL #6    Title  Pt will be able to descend 12+ steps holding L rail with confident, <2/10 pain most of the time.      Time  6    Period  Weeks    Status  New    Target Date  05/10/18             Plan - 03/31/18 1022    Clinical Impression Statement  Pt reports feeling a little better with descending stairs, but is still having pain on the medial aspect of her left knee. Pt is keeping up with her HEP and reports no changes after last ultrasound tx. Trialed ionto on left lateral knee per patient's request. PT came into tx to perform grade V ankle manipulation to improve pt's pain and mobility. Pt reported feeling better after tx.    PT Next Visit Plan  check HEP, manual, ROM , rec.  bike, add heel raises, Continue hip exercises and try ankle distraction; update HEP    PT Home Exercise Plan  quad set, SLR/VMO, hamstring, ITB and gastroc,    Consulted and Agree with Plan of Care  Patient      During this treatment session, the therapist was present, participating in and directing the treatment.  Patient will benefit from skilled therapeutic intervention in order to improve the following deficits and impairments:     Visit Diagnosis: Difficulty in walking, not elsewhere classified  Localized edema  Acute pain of left knee  Stiffness of left knee, not elsewhere classified  Muscle weakness (generalized)     Problem List Patient Active Problem List   Diagnosis Date Noted  . Problems with swallowing and mastication   . Globus hystericus   . Gastritis   . Hiatal hernia     Royetta Asal, SPTA 03/31/2018, 10:36 AM   Jannette Spanner, PTA 03/31/18 10:37 AM Phone: [phone] Fax: [phone]  [facility] [address] [place], [place], [address] Phone: [phone]   Fax:  [phone]  Name: Erin Lucero MRN: [id number] Date of Birth: 04/30/1960

## 2018-04-05 ENCOUNTER — Ambulatory Visit: Payer: 59 | Admitting: Physical Therapy

## 2018-04-05 ENCOUNTER — Encounter: Payer: Self-pay | Admitting: Physical Therapy

## 2018-04-05 DIAGNOSIS — M6281 Muscle weakness (generalized): Secondary | ICD-10-CM

## 2018-04-05 DIAGNOSIS — R262 Difficulty in walking, not elsewhere classified: Secondary | ICD-10-CM

## 2018-04-05 DIAGNOSIS — M25562 Pain in left knee: Secondary | ICD-10-CM

## 2018-04-05 DIAGNOSIS — R600 Localized edema: Secondary | ICD-10-CM | POA: Diagnosis not present

## 2018-04-05 DIAGNOSIS — M25662 Stiffness of left knee, not elsewhere classified: Secondary | ICD-10-CM

## 2018-04-05 NOTE — Therapy (Signed)
[facility] [address] [place], [place], [address] Phone: [phone]   Fax:  [phone]  Physical Therapy Treatment  Patient Details  Name: Erin Lucero MRN: [id number] Date of Birth: 12-12-1960 Referring Provider (PT): Dr. Sherlean FootLucey    Encounter Date: 04/05/2018  PT End of Session - 04/05/18 0858    Visit Number  9    Number of Visits  12    Date for PT Re-Evaluation  04/09/18    PT Start Time  0821   Pt late   PT Stop Time  0857    PT Time Calculation (min)  36 min    Activity Tolerance  Patient tolerated treatment well    Behavior During Therapy  [facility] for tasks assessed/performed       Past Medical History:  Diagnosis Date  . Arthritis    right great toe  . Esophageal spasm   . GERD (gastroesophageal reflux disease)   . Wears contact lenses     Past Surgical History:  Procedure Laterality Date  . AUGMENTATION MAMMAPLASTY Bilateral 2001   retro pec saline  . BREAST ENHANCEMENT SURGERY  2005  . COLONOSCOPY    . ESOPHAGOGASTRODUODENOSCOPY (EGD) WITH PROPOFOL N/A 08/30/2015   Procedure: ESOPHAGOGASTRODUODENOSCOPY (EGD) WITH PROPOFOL;  Surgeon: Midge Miniumarren Wohl, MD;  [facility];  Service: Endoscopy;  Laterality: N/A;  . SHOULDER ARTHROSCOPY WITH SUBACROMIAL DECOMPRESSION, ROTATOR CUFF REPAIR AND BICEP TENDON REPAIR  02/02/2012   Procedure: SHOULDER ARTHROSCOPY WITH SUBACROMIAL DECOMPRESSION, ROTATOR CUFF REPAIR AND BICEP TENDON REPAIR;  Surgeon: Mable ParisJustin William Chandler, MD;  Location: [facility];  Service: Orthopedics;  Laterality: Left;  Possible Bicep Tendondesis, open tenodesis    There were no vitals filed for this visit.  Subjective Assessment - 04/05/18 0825    Subjective  Pt denies pain and reports that the ionto patch did help. "I went to see my MD last week and he said if I came back in 5 weeks and if I still had pain he would give me a shot. But I've been icing my knee every  night."    Currently in Pain?  No/denies                       [facility]/Exercise - 04/05/18 0001      Knee/Hip Exercises: Stretches   Active Hamstring Stretch  2 reps;30 seconds   seated with strap and overpressure to promote extension     Knee/Hip Exercises: Aerobic   Tread Mill  Retro walking up to grade 5 with 0.9 speed for 6 min   Cues for emphasizing heel strike   Recumbent Bike  4 min Level 4      Knee/Hip Exercises: Standing   Terminal Knee Extension  AROM;Strengthening;Left;20 reps;Theraband    Theraband Level (Terminal Knee Extension)  Level 2 (Red)    Terminal Knee Extension Limitations  Also did 15 reps with ball       Modalities   Modalities  Cryotherapy      Cryotherapy   Number Minutes Cryotherapy  9 Minutes    Cryotherapy Location  Knee    Type of Cryotherapy  Ice pack   Added #6 on ant knee to promote extension     Manual Therapy   Manual Therapy  Joint mobilization      Joint Mobilization  Patellar Mobs while provided knee extension mobs   Pain on inferior glide; stiff on anterior/medial glide  PT Education - 04/05/18 0859    Education provided  Yes    Education Details  HEP    Person(s) Educated  Patient    Methods  Explanation;Handout;Demonstration    Comprehension  Verbalized understanding;Returned demonstration          PT Long Term Goals - 03/29/18 0817      PT LONG TERM GOAL #1   Title  Patient will be I with HEP for hip and knee ROM, strength     Status  On-going    Target Date  05/10/18      PT LONG TERM GOAL #2   Title  Pt will be able to extend knee to no more than 5 deg lacking extension on L for normal gait mechanics     Status  On-going    Target Date  05/10/18      PT LONG TERM GOAL #3   Title  FOTO score will improve to less than 45% impaired to demo functional improvement     Baseline  50%    Status  On-going    Target Date  05/10/18      PT LONG TERM GOAL #4   Title   Pt will be able to squat and lunge without  pain in L knee, controlled range of motion     Status  Achieved    Target Date  05/10/18      PT LONG TERM GOAL #5   Title  Pt will be able to stand on LLE for 30 sec and perform min dynamic activity    Status  On-going    Target Date  05/10/18      PT LONG TERM GOAL #6   Title  Pt will be able to descend 12+ steps holding L rail with confident, <2/10 pain most of the time.      Time  6    Period  Weeks    Status  New    Target Date  05/10/18            Plan - 04/05/18 0900    Clinical Impression Statement  Pt denies pain today, but stated that her MD told her to ice her knee twice a day and she reported icing her knee every night. She said the pain in her lateral knee was much better and she thought the ionto helped. Pt denied having ionto again, but provided and ice pack with ankle propped on towel and 6lb weight on anterior knee to promote knee extension. Updated HEP with TKE and seated hamstring stretch.     PT Next Visit Plan  check HEP, manual, ROM , rec.  bike, add heel raises, Continue hip exercises and try ankle distraction; continue and progress retro walking     PT Home Exercise Plan  quad set, SLR/VMO, hamstring, ITB and gastroc, standing TKE    Consulted and Agree with Plan of Care  Patient     During this treatment session, the therapist was present, participating in and directing the treatment.  Patient will benefit from skilled therapeutic intervention in order to improve the following deficits and impairments:     Visit Diagnosis: Difficulty in walking, not elsewhere classified  Localized edema  Acute pain of left knee  Stiffness of left knee, not elsewhere classified  Muscle weakness (generalized)     Problem List Patient Active Problem List   Diagnosis Date Noted  . Problems with swallowing and mastication   . Globus hystericus   .  Gastritis   . Hiatal hernia     Royetta Asal , SPTA 04/05/2018,  9:16 AM   Jannette Spanner, PTA 04/05/18 10:57 AM Phone: [phone] Fax: [phone]  [facility] [address] [place], [place], [address] Phone: [phone]   Fax:  [phone]  Name: Erin Lucero MRN: [id number] Date of Birth: 03-28-1960

## 2018-04-08 ENCOUNTER — Encounter: Payer: Self-pay | Admitting: Physical Therapy

## 2018-04-08 ENCOUNTER — Ambulatory Visit: Payer: 59 | Admitting: Physical Therapy

## 2018-04-08 DIAGNOSIS — R262 Difficulty in walking, not elsewhere classified: Secondary | ICD-10-CM | POA: Diagnosis not present

## 2018-04-08 DIAGNOSIS — M6281 Muscle weakness (generalized): Secondary | ICD-10-CM

## 2018-04-08 DIAGNOSIS — M25662 Stiffness of left knee, not elsewhere classified: Secondary | ICD-10-CM | POA: Diagnosis not present

## 2018-04-08 DIAGNOSIS — R600 Localized edema: Secondary | ICD-10-CM

## 2018-04-08 DIAGNOSIS — M25562 Pain in left knee: Secondary | ICD-10-CM

## 2018-04-08 NOTE — Therapy (Signed)
[facility] [address] [place], [place], [address] Phone: [phone]   Fax:  [phone]  Physical Therapy Treatment  Patient Details  Name: Erin Lucero MRN: [id number] Date of Birth: Dec 01, 1960 Referring Provider (PT): Dr. Sherlean Foot    Encounter Date: 04/08/2018  PT End of Session - 04/08/18 0902    Visit Number  10    Number of Visits  12    Date for PT Re-Evaluation  05/10/18    PT Start Time  0812    PT Stop Time  0851    PT Time Calculation (min)  39 min    Activity Tolerance  Patient tolerated treatment well    Behavior During Therapy  [facility] for tasks assessed/performed       Past Medical History:  Diagnosis Date  . Arthritis    right great toe  . Esophageal spasm   . GERD (gastroesophageal reflux disease)   . Wears contact lenses     Past Surgical History:  Procedure Laterality Date  . AUGMENTATION MAMMAPLASTY Bilateral 2001   retro pec saline  . BREAST ENHANCEMENT SURGERY  2005  . COLONOSCOPY    . ESOPHAGOGASTRODUODENOSCOPY (EGD) WITH PROPOFOL N/A 08/30/2015   Procedure: ESOPHAGOGASTRODUODENOSCOPY (EGD) WITH PROPOFOL;  Surgeon: Midge Minium, MD;  Location: [facility];  Service: Endoscopy;  Laterality: N/A;  . SHOULDER ARTHROSCOPY WITH SUBACROMIAL DECOMPRESSION, ROTATOR CUFF REPAIR AND BICEP TENDON REPAIR  02/02/2012   Procedure: SHOULDER ARTHROSCOPY WITH SUBACROMIAL DECOMPRESSION, ROTATOR CUFF REPAIR AND BICEP TENDON REPAIR;  Surgeon: Mable Paris, MD;  Location: [facility];  Service: Orthopedics;  Laterality: Left;  Possible Bicep Tendondesis, open tenodesis    There were no vitals filed for this visit.  Subjective Assessment - 04/08/18 0814    Subjective  Pt reports pulling a muscle in her groin area and is worse when standing after sitting for a while. She states that the stiffness is getting worse and is still having difficulties going down stairs.     Currently in Pain?   Yes    Pain Location  Knee    Pain Orientation  Left    Pain Descriptors / Indicators  Aching;Sore    Aggravating Factors   Standing after sitting for a while    Pain Relieving Factors  Ice at night for 20 minutes, meds                       [facility] Adult PT Treatment/Exercise - 04/08/18 0001      Ambulation/Gait   Stairs  Yes    Stairs Assistance  --   Hand hold 1; Step ups and downs 3x15; lateral step downs2x10   Stairs Assistance Details (indicate cue type and reason)  Cues for heel strike down during lateral and step downs    Stair Management Technique  --   Cues for heel strike on decent and hand placement for assist   Height of Stairs  4      Self-Care   Self-Care  Other Self-Care Comments    Other Self-Care Comments   Standing at work and using a stool to alternate leg positions while standing and alternating with sitting throughout day      Knee/Hip Exercises: Stretches   Passive Hamstring Stretch  2 reps;30 seconds   With contract relax     Knee/Hip Exercises: Aerobic   Recumbent Bike  5 min Level 5      Knee/Hip Exercises: Standing  Wall Squat  20 reps   With physioball     Manual Therapy   Manual Therapy  Joint mobilization   PT provided grade V    Joint Mobilization  Patellar Mobs while provided knee extension mobs; gentle axial distraction to LLE   Pain on inferior glide; stiff on anterior/medial glide                 PT Long Term Goals - 03/29/18 0817      PT LONG TERM GOAL #1   Title  Patient will be I with HEP for hip and knee ROM, strength     Status  On-going    Target Date  05/10/18      PT LONG TERM GOAL #2   Title  Pt will be able to extend knee to no more than 5 deg lacking extension on L for normal gait mechanics     Status  On-going    Target Date  05/10/18      PT LONG TERM GOAL #3   Title  FOTO score will improve to less than 45% impaired to demo functional improvement     Baseline  50%    Status  On-going     Target Date  05/10/18      PT LONG TERM GOAL #4   Title  Pt will be able to squat and lunge without  pain in L knee, controlled range of motion     Status  Achieved    Target Date  05/10/18      PT LONG TERM GOAL #5   Title  Pt will be able to stand on LLE for 30 sec and perform min dynamic activity    Status  On-going    Target Date  05/10/18      PT LONG TERM GOAL #6   Title  Pt will be able to descend 12+ steps holding L rail with confident, <2/10 pain most of the time.      Time  6    Period  Weeks    Status  New    Target Date  05/10/18            Plan - 04/08/18 0906    Clinical Impression Statement  Pt reports some mild pain today and stated that she is having worse pain with standing after prolonged sitting. Pt is still lacking full extension, so terminal knee extension exercises performed today, but pt reports that she has not been doing the TKE at home yet. Provided joint mobilizations to increase knee extension and reduce pain at joint line. Performed stair training, while focusing on descending in pain free ranges to increase quad strength. Cues for form and staying in pain free ranges.     PT Next Visit Plan  check HEP, manual, ROM , rec.  bike, add heel raises, Continue hip exercises and try ankle distraction; continue and progress retro walking; TKE    PT Home Exercise Plan  quad set, SLR/VMO, hamstring, ITB and gastroc, standing TKE    Consulted and Agree with Plan of Care  Patient       Patient will benefit from skilled therapeutic intervention in order to improve the following deficits and impairments:  Abnormal gait, Decreased balance, Decreased mobility, Hypomobility, Pain, Impaired flexibility, Increased fascial restricitons, Decreased strength, Decreased range of motion, Decreased scar mobility, Increased edema  Visit Diagnosis: Difficulty in walking, not elsewhere classified  Localized edema  Acute pain of left knee  Stiffness of  left knee, not  elsewhere classified  Muscle weakness (generalized)     Problem List Patient Active Problem List   Diagnosis Date Noted  . Problems with swallowing and mastication   . Globus hystericus   . Gastritis   . Hiatal hernia     Sherrie Mustache , [place] 04/08/2018, 10:29 AM   Jannette Spanner, PTA 04/08/18 10:29 AM Phone: [phone] Fax: [phone]  [facility] [address] [place], [place], [address] Phone: [phone]   Fax:  [phone]  Name: Erin Lucero MRN: [id number] Date of Birth: 12/09/1960

## 2018-04-13 ENCOUNTER — Encounter: Payer: Self-pay | Admitting: Physical Therapy

## 2018-04-13 ENCOUNTER — Ambulatory Visit: Payer: 59 | Admitting: Physical Therapy

## 2018-04-13 DIAGNOSIS — R600 Localized edema: Secondary | ICD-10-CM | POA: Diagnosis not present

## 2018-04-13 DIAGNOSIS — M6281 Muscle weakness (generalized): Secondary | ICD-10-CM | POA: Diagnosis not present

## 2018-04-13 DIAGNOSIS — Z01419 Encounter for gynecological examination (general) (routine) without abnormal findings: Secondary | ICD-10-CM | POA: Diagnosis not present

## 2018-04-13 DIAGNOSIS — M25662 Stiffness of left knee, not elsewhere classified: Secondary | ICD-10-CM

## 2018-04-13 DIAGNOSIS — M25562 Pain in left knee: Secondary | ICD-10-CM

## 2018-04-13 DIAGNOSIS — R262 Difficulty in walking, not elsewhere classified: Secondary | ICD-10-CM

## 2018-04-13 DIAGNOSIS — N63 Unspecified lump in unspecified breast: Secondary | ICD-10-CM | POA: Diagnosis not present

## 2018-04-13 NOTE — Therapy (Signed)
[facility] [address] [place], [place], [address] Phone: [phone]   Fax:  [phone]  Physical Therapy Treatment  Patient Details  Name: Erin Lucero MRN: [id number] Date of Birth: 1961/02/17 Referring Provider (PT): Dr. Sherlean Foot    Encounter Date: 04/13/2018  PT End of Session - 04/13/18 0809    Visit Number  11    Date for PT Re-Evaluation  05/10/18    PT Start Time  0807    PT Stop Time  0851    PT Time Calculation (min)  44 min    Activity Tolerance  Patient tolerated treatment well    Behavior During Therapy  [facility] for tasks assessed/performed       Past Medical History:  Diagnosis Date  . Arthritis    right great toe  . Esophageal spasm   . GERD (gastroesophageal reflux disease)   . Wears contact lenses     Past Surgical History:  Procedure Laterality Date  . AUGMENTATION MAMMAPLASTY Bilateral 2001   retro pec saline  . BREAST ENHANCEMENT SURGERY  2005  . COLONOSCOPY    . ESOPHAGOGASTRODUODENOSCOPY (EGD) WITH PROPOFOL N/A 08/30/2015   Procedure: ESOPHAGOGASTRODUODENOSCOPY (EGD) WITH PROPOFOL;  Surgeon: Midge Minium, MD;  Location: [facility];  Service: Endoscopy;  Laterality: N/A;  . SHOULDER ARTHROSCOPY WITH SUBACROMIAL DECOMPRESSION, ROTATOR CUFF REPAIR AND BICEP TENDON REPAIR  02/02/2012   Procedure: SHOULDER ARTHROSCOPY WITH SUBACROMIAL DECOMPRESSION, ROTATOR CUFF REPAIR AND BICEP TENDON REPAIR;  Surgeon: Mable Paris, MD;  Location: [facility];  Service: Orthopedics;  Laterality: Left;  Possible Bicep Tendondesis, open tenodesis    There were no vitals filed for this visit.  Subjective Assessment - 04/13/18 0807    Subjective  If I sit >an hour for a meeting I have pain in joint line, more than moderate.  Walking is usually OK.  Tender with going up stairs.  He may inject the knee if it is still like thisin 2 weeks      Currently in Pain?  No/denies           Pilates Reformer used for LE/core strength, postural strength, lumbopelvic disassociation and core control.  Exercises included:  Footwork  2 Red 1 Blue    Parallel, heels arch and forefoot and turnout as well on forefoot  Running/prancing with heel raises x 10    Double and single leg work  (used same springs for single leg each side)   Single leg in parallel and turnout    LLE needed increased time , instability noted but improved with visual feedback  Bridging 2 Red and 1 blue   Used ball for alignment x 8 and band for abd challenge x 8   Hamstring , ITB and ant hip stretching 1 red , 60 sec each   Feet in Straps 1 Red 1 yellow   Arcs and circles and squats (parallel and hip ER)  Pt noticed L LE fatigue, shaking with full hip flexion, straining hamstring Good core control , very slow pace      PT Long Term Goals - 03/29/18 0817      PT LONG TERM GOAL #1   Title  Patient will be I with HEP for hip and knee ROM, strength     Status  On-going    Target Date  05/10/18      PT LONG TERM GOAL #2   Title  Pt will be able to extend knee to no more than  5 deg lacking extension on L for normal gait mechanics     Status  On-going    Target Date  05/10/18      PT LONG TERM GOAL #3   Title  FOTO score will improve to less than 45% impaired to demo functional improvement     Baseline  50%    Status  On-going    Target Date  05/10/18      PT LONG TERM GOAL #4   Title  Pt will be able to squat and lunge without  pain in L knee, controlled range of motion     Status  Achieved    Target Date  05/10/18      PT LONG TERM GOAL #5   Title  Pt will be able to stand on LLE for 30 sec and perform min dynamic activity    Status  On-going    Target Date  05/10/18      PT LONG TERM GOAL #6   Title  Pt will be able to descend 12+ steps holding L rail with confident, <2/10 pain most of the time.      Time  6    Period  Weeks    Status  New    Target Date  05/10/18             Plan - 04/13/18 0827    Clinical Impression Statement  Patient with good response to Pilates equipment for Knee and hip strength, core control.  She was able to increae awareness of imbalances of strength and flexibility.   She would like to use the Reformer again.     PT Treatment/Interventions  ADLs/Self Care Home Management;Cryotherapy;Electrical Stimulation;Moist Heat;Functional mobility training;Therapeutic activities;Orthotic Fit/Training;Ultrasound;Gait training;Therapeutic exercise;Stair training;Balance training;Neuromuscular re-education;Taping;Scar mobilization;Manual techniques;Passive range of motion;Vasopneumatic Device;Patient/family education;Iontophoresis 4mg /ml Dexamethasone    PT Next Visit Plan  Reformer as it integrates her hips, core and knees.  Try Scooter, ant hip stretch and hinge.  Retro gait , knee control     PT Home Exercise Plan  quad set, SLR/VMO, hamstring, ITB and gastroc, standing TKE    Consulted and Agree with Plan of Care  Patient       Patient will benefit from skilled therapeutic intervention in order to improve the following deficits and impairments:  Abnormal gait, Decreased balance, Decreased mobility, Hypomobility, Pain, Impaired flexibility, Increased fascial restricitons, Decreased strength, Decreased range of motion, Decreased scar mobility, Increased edema  Visit Diagnosis: Difficulty in walking, not elsewhere classified  Localized edema  Acute pain of left knee  Stiffness of left knee, not elsewhere classified  Muscle weakness (generalized)     Problem List Patient Active Problem List   Diagnosis Date Noted  . Problems with swallowing and mastication   . Globus hystericus   . Gastritis   . Hiatal hernia     PAA,JENNIFER 04/13/2018, 8:54 AM  [facility] [address] [place], [place], [address] Phone: [phone]   Fax:  [phone]  Name: Erin Lucero MRN:  [id number] Date of Birth: October 09, 1960  Karie Mainland, PT 04/13/18 8:54 AM Phone: [phone] Fax: [phone]

## 2018-04-14 ENCOUNTER — Other Ambulatory Visit: Payer: Self-pay | Admitting: Nurse Practitioner

## 2018-04-14 DIAGNOSIS — N6310 Unspecified lump in the right breast, unspecified quadrant: Secondary | ICD-10-CM

## 2018-04-16 ENCOUNTER — Ambulatory Visit: Payer: 59 | Admitting: Physical Therapy

## 2018-04-19 ENCOUNTER — Other Ambulatory Visit: Payer: Self-pay

## 2018-04-19 DIAGNOSIS — M1712 Unilateral primary osteoarthritis, left knee: Secondary | ICD-10-CM | POA: Diagnosis not present

## 2018-04-19 DIAGNOSIS — Z9889 Other specified postprocedural states: Secondary | ICD-10-CM | POA: Diagnosis not present

## 2018-04-21 ENCOUNTER — Encounter: Payer: Self-pay | Admitting: Physical Therapy

## 2018-04-21 ENCOUNTER — Ambulatory Visit: Payer: 59 | Admitting: Physical Therapy

## 2018-04-21 DIAGNOSIS — R262 Difficulty in walking, not elsewhere classified: Secondary | ICD-10-CM

## 2018-04-21 DIAGNOSIS — M6281 Muscle weakness (generalized): Secondary | ICD-10-CM | POA: Diagnosis not present

## 2018-04-21 DIAGNOSIS — R600 Localized edema: Secondary | ICD-10-CM | POA: Diagnosis not present

## 2018-04-21 DIAGNOSIS — M25562 Pain in left knee: Secondary | ICD-10-CM | POA: Diagnosis not present

## 2018-04-21 DIAGNOSIS — M25662 Stiffness of left knee, not elsewhere classified: Secondary | ICD-10-CM

## 2018-04-21 NOTE — Therapy (Signed)
[facility] [address] [place], [place], [address] Phone: [phone]   Fax:  [phone]  Physical Therapy Treatment  Patient Details  Name: Erin Lucero MRN: [id number] Date of Birth: 1960/09/21 Referring Provider (PT): Dr. Sherlean Foot    Encounter Date: 04/21/2018  PT End of Session - 04/21/18 0823    Visit Number  12    Date for PT Re-Evaluation  05/10/18    PT Start Time  0807    PT Stop Time  0850    PT Time Calculation (min)  43 min    Activity Tolerance  Patient tolerated treatment well    Behavior During Therapy  [facility] for tasks assessed/performed       Past Medical History:  Diagnosis Date  . Arthritis    right great toe  . Esophageal spasm   . GERD (gastroesophageal reflux disease)   . Wears contact lenses     Past Surgical History:  Procedure Laterality Date  . AUGMENTATION MAMMAPLASTY Bilateral 2001   retro pec saline  . BREAST ENHANCEMENT SURGERY  2005  . COLONOSCOPY    . ESOPHAGOGASTRODUODENOSCOPY (EGD) WITH PROPOFOL N/A 08/30/2015   Procedure: ESOPHAGOGASTRODUODENOSCOPY (EGD) WITH PROPOFOL;  Surgeon: Midge Minium, MD;  Location: [facility];  Service: Endoscopy;  Laterality: N/A;  . SHOULDER ARTHROSCOPY WITH SUBACROMIAL DECOMPRESSION, ROTATOR CUFF REPAIR AND BICEP TENDON REPAIR  02/02/2012   Procedure: SHOULDER ARTHROSCOPY WITH SUBACROMIAL DECOMPRESSION, ROTATOR CUFF REPAIR AND BICEP TENDON REPAIR;  Surgeon: Mable Paris, MD;  Location: [facility];  Service: Orthopedics;  Laterality: Left;  Possible Bicep Tendondesis, open tenodesis    There were no vitals filed for this visit.  Subjective Assessment - 04/21/18 0811    Subjective  When I stand and begin to walk its an 8/10. Pain posterior.  Has to stretch and prepare to stand and walk.  Gave way last week and I almost fell into the door.  Saw the PA.  XR were normal.  Will get injection tomrrow PM.     Currently in Pain?   Yes    Pain Score  8     Pain Location  Knee    Pain Orientation  Left;Posterior    Pain Descriptors / Indicators  Tightness;Aching    Pain Type  Surgical pain    Pain Onset  More than a month ago    Pain Frequency  Intermittent    Aggravating Factors   standing after sitting     Pain Relieving Factors  ice, stretching it           [facility] Adult PT Treatment/Exercise - 04/21/18 0001      Pilates   Pilates Reformer  see note       Knee/Hip Exercises: Aerobic   Stationary Bike  L2 for 6 min          Pilates Reformer used for LE/core strength, postural strength, lumbopelvic disassociation and core control.  Exercises included:  Footwork 2 Red 1 Blue parallel on heels, arches and forefoot, turnout on heels  Focus on control and TKE  Feet in Straps 1 Red stretching hamstring, ITB and adductors, about 60 sec each x 2   Sidelying 1 Red 1 blue LLE parallel and turnout on heels and toes . Fatigue in hip and lower leg.      PT Long Term Goals - 03/29/18 4709      PT LONG TERM GOAL #1   Title  Patient will be I with  HEP for hip and knee ROM, strength     Status  On-going    Target Date  05/10/18      PT LONG TERM GOAL #2   Title  Pt will be able to extend knee to no more than 5 deg lacking extension on L for normal gait mechanics     Status  On-going    Target Date  05/10/18      PT LONG TERM GOAL #3   Title  FOTO score will improve to less than 45% impaired to demo functional improvement     Baseline  50%    Status  On-going    Target Date  05/10/18      PT LONG TERM GOAL #4   Title  Pt will be able to squat and lunge without  pain in L knee, controlled range of motion     Status  Achieved    Target Date  05/10/18      PT LONG TERM GOAL #5   Title  Pt will be able to stand on LLE for 30 sec and perform min dynamic activity    Status  On-going    Target Date  05/10/18      PT LONG TERM GOAL #6   Title  Pt will be able to descend 12+ steps holding L rail with  confident, <2/10 pain most of the time.      Time  6    Period  Weeks    Status  New    Target Date  05/10/18            Plan - 04/21/18 9163    Clinical Impression Statement  Pt does feel she is worsening as the weeks go on. She is functionally less able to walk, sit and transition.  She has no swelling. Lacks about 10 deg full extension .  Antalgic gait  .  Less pain post. LLE fatigued at end of session.      PT Treatment/Interventions  ADLs/Self Care Home Management;Cryotherapy;Electrical Stimulation;Moist Heat;Functional mobility training;Therapeutic activities;Orthotic Fit/Training;Ultrasound;Gait training;Therapeutic exercise;Stair training;Balance training;Neuromuscular re-education;Taping;Scar mobilization;Manual techniques;Passive range of motion;Vasopneumatic Device;Patient/family education;Iontophoresis 4mg /ml Dexamethasone    PT Next Visit Plan  Reformer as it integrates her hips, core and knees.  Try Scooter, ant hip stretch and hinge.  Retro gait , knee control     PT Home Exercise Plan  quad set, SLR/VMO, hamstring, ITB and gastroc, standing TKE    Consulted and Agree with Plan of Care  Patient       Patient will benefit from skilled therapeutic intervention in order to improve the following deficits and impairments:  Abnormal gait, Decreased balance, Decreased mobility, Hypomobility, Pain, Impaired flexibility, Increased fascial restricitons, Decreased strength, Decreased range of motion, Decreased scar mobility, Increased edema  Visit Diagnosis: Difficulty in walking, not elsewhere classified  Localized edema  Stiffness of left knee, not elsewhere classified  Muscle weakness (generalized)  Acute pain of left knee     Problem List Patient Active Problem List   Diagnosis Date Noted  . Problems with swallowing and mastication   . Globus hystericus   . Gastritis   . Hiatal hernia     PAA,JENNIFER 04/21/2018, 9:43 AM  [facility] [address] [place], [place], [address] Phone: [phone]   Fax:  [phone]  Name: Erin Lucero MRN: [id number] Date of Birth: Feb 18, 1961  Karie Mainland, PT 04/21/18 9:47 AM Phone: [phone] Fax: [phone]

## 2018-04-22 DIAGNOSIS — M1712 Unilateral primary osteoarthritis, left knee: Secondary | ICD-10-CM | POA: Diagnosis not present

## 2018-04-22 DIAGNOSIS — M25562 Pain in left knee: Secondary | ICD-10-CM | POA: Diagnosis not present

## 2018-04-22 DIAGNOSIS — Z9889 Other specified postprocedural states: Secondary | ICD-10-CM | POA: Diagnosis not present

## 2018-04-22 DIAGNOSIS — G8929 Other chronic pain: Secondary | ICD-10-CM | POA: Diagnosis not present

## 2018-04-23 ENCOUNTER — Ambulatory Visit
Admission: RE | Admit: 2018-04-23 | Discharge: 2018-04-23 | Disposition: A | Discharge status: Home / Self Care | Payer: 59 | Source: Ambulatory Visit | Attending: Nurse Practitioner | Admitting: Nurse Practitioner

## 2018-04-23 ENCOUNTER — Other Ambulatory Visit: Payer: Self-pay | Admitting: Nurse Practitioner

## 2018-04-23 DIAGNOSIS — N6001 Solitary cyst of right breast: Secondary | ICD-10-CM | POA: Diagnosis not present

## 2018-04-23 DIAGNOSIS — N6310 Unspecified lump in the right breast, unspecified quadrant: Secondary | ICD-10-CM

## 2018-04-23 DIAGNOSIS — R922 Inconclusive mammogram: Secondary | ICD-10-CM | POA: Diagnosis not present

## 2018-04-28 ENCOUNTER — Ambulatory Visit: Payer: 59 | Attending: Orthopedic Surgery | Admitting: Physical Therapy

## 2018-04-28 ENCOUNTER — Encounter: Payer: Self-pay | Admitting: Physical Therapy

## 2018-04-28 DIAGNOSIS — M25662 Stiffness of left knee, not elsewhere classified: Secondary | ICD-10-CM

## 2018-04-28 DIAGNOSIS — R600 Localized edema: Secondary | ICD-10-CM

## 2018-04-28 DIAGNOSIS — R262 Difficulty in walking, not elsewhere classified: Secondary | ICD-10-CM | POA: Diagnosis not present

## 2018-04-28 DIAGNOSIS — M6281 Muscle weakness (generalized): Secondary | ICD-10-CM | POA: Diagnosis not present

## 2018-04-28 NOTE — Therapy (Addendum)
[place], [place], [address] Phone: [phone]   Fax:  [phone]  Physical Therapy Treatment/Discharge  Patient Details  Name: Erin Lucero MRN: [id number] Date of Birth: 1960/12/16 Referring Provider (PT): Dr. Ronnie Derby    Encounter Date: 04/28/2018  PT End of Session - 04/28/18 0812    Visit Number  13    Number of Visits  12    Date for PT Re-Evaluation  05/10/18    PT Start Time  0810    PT Stop Time  0844    PT Time Calculation (min)  34 min       Past Medical History:  Diagnosis Date  . Arthritis    right great toe  . Esophageal spasm   . GERD (gastroesophageal reflux disease)   . Wears contact lenses     Past Surgical History:  Procedure Laterality Date  . AUGMENTATION MAMMAPLASTY Bilateral 2001   retro pec saline  . BREAST ENHANCEMENT SURGERY  2005  . COLONOSCOPY    . ESOPHAGOGASTRODUODENOSCOPY (EGD) WITH PROPOFOL N/A 08/30/2015   Procedure: ESOPHAGOGASTRODUODENOSCOPY (EGD) WITH PROPOFOL;  Surgeon: Lucilla Lame, MD;  Location: [facility];  Service: Endoscopy;  Laterality: N/A;  . SHOULDER ARTHROSCOPY WITH SUBACROMIAL DECOMPRESSION, ROTATOR CUFF REPAIR AND BICEP TENDON REPAIR  02/02/2012   Procedure: SHOULDER ARTHROSCOPY WITH SUBACROMIAL DECOMPRESSION, ROTATOR CUFF REPAIR AND BICEP TENDON REPAIR;  Surgeon: Nita Sells, MD;  Location: [facility];  Service: Orthopedics;  Laterality: Left;  Possible Bicep Tendondesis, open tenodesis    There were no vitals filed for this visit.  Subjective Assessment - 04/28/18 0810    Subjective  I had the gel injection. MD said it would be 4 weeks until I get the full benefit of the injection.     Currently in Pain?  Yes    Pain Location  Knee                       [facility] Adult PT Treatment/Exercise - 04/28/18 0001      Knee/Hip Exercises: Aerobic   Recumbent Bike  L2 x 4 minutes       Knee/Hip  Exercises: Supine   Quad Sets  10 reps    Quad Sets Limitations  with ball squeeze     Short Arc Quad Sets  10 reps    Short Arc Quad Sets Limitations  with ball squeeze     Terminal Knee Extension Limitations  into ball, into green band, into towel various x 10 each     Bridges with [employer]  10 reps    Straight Leg Raise with External Rotation  10 reps      Manual Therapy   Joint Mobilization  Knee extension mobs - A/P Femur mobs grade 2, 3 with PROM extension afterward        Pilates Reformer used for LE/core strength, postural strength, lumbopelvic disassociation and core control.  Exercises included:  Footwork 2 Red 1 Blue parallel on heels, arches and forefoot, turnout on heels             Focus on control and TKE  Sidelying 2 red  LLE parallel and turnout on heels and toes . Fatigue in hip and lower leg.        PT Long Term Goals - 03/29/18 0817      PT LONG TERM GOAL #1   Title  Patient will be I with HEP for hip  and knee ROM, strength     Status  On-going    Target Date  05/10/18      PT LONG TERM GOAL #2   Title  Pt will be able to extend knee to no more than 5 deg lacking extension on L for normal gait mechanics     Status  On-going    Target Date  05/10/18      PT LONG TERM GOAL #3   Title  FOTO score will improve to less than 45% impaired to demo functional improvement     Baseline  50%    Status  On-going    Target Date  05/10/18      PT LONG TERM GOAL #4   Title  Pt will be able to squat and lunge without  pain in L knee, controlled range of motion     Status  Achieved    Target Date  05/10/18      PT LONG TERM GOAL #5   Title  Pt will be able to stand on LLE for 30 sec and perform min dynamic activity    Status  On-going    Target Date  05/10/18      PT LONG TERM GOAL #6   Title  Pt will be able to descend 12+ steps holding L rail with confident, <2/10 pain most of the time.      Time  6    Period  Weeks    Status  New    Target Date   05/10/18            Plan - 04/28/18 0836    Clinical Impression Statement  Pt reports no change since injection 1 week ago. Still lacking extension. Focused VMO activation with quad strength as well as manual to increase knee extension. Ended on reformer for AROM after manual.     PT Next Visit Plan  Reformer as it integrates her hips, core and knees.  Try Scooter, ant hip stretch and hinge.  Retro gait , knee control     PT Home Exercise Plan  quad set, SLR/VMO, hamstring, ITB and gastroc, standing TKE    Consulted and Agree with Plan of Care  Patient       Patient will benefit from skilled therapeutic intervention in order to improve the following deficits and impairments:  Abnormal gait, Decreased balance, Decreased mobility, Hypomobility, Pain, Impaired flexibility, Increased fascial restricitons, Decreased strength, Decreased range of motion, Decreased scar mobility, Increased edema  Visit Diagnosis: Difficulty in walking, not elsewhere classified  Localized edema  Stiffness of left knee, not elsewhere classified  Muscle weakness (generalized)     Problem List Patient Active Problem List   Diagnosis Date Noted  . Problems with swallowing and mastication   . Globus hystericus   . Gastritis   . Hiatal hernia     Dorene Ar, PTA 04/28/2018, 8:45 AM  [facility] [address] [place], [place], [address] Phone: [phone]   Fax:  [phone]  Name: Erin Lucero MRN: [id number] Date of Birth: August 17, 1960  PHYSICAL THERAPY DISCHARGE SUMMARY  Visits from Start of Care: 13  Current functional level related to goals / functional outcomes: See above for most recent    Remaining deficits: See above for most recent info   Education / Equipment: HEP, RICE Plan: Patient agrees to discharge.  Patient goals were partially met. Patient is being discharged due to not returning since the last visit.  ?????  Patient cancelled her last appt.  She was having increased pain, received an injection.  I expect she is incredibly busy with work.  She may need to return when her schedule allows.    Raeford Razor, PT 05/12/18 12:20 PM Phone: [phone] Fax: [phone]

## 2018-04-30 ENCOUNTER — Ambulatory Visit: Payer: 59 | Admitting: Physical Therapy

## 2018-05-06 DIAGNOSIS — Z9889 Other specified postprocedural states: Secondary | ICD-10-CM | POA: Diagnosis not present

## 2018-05-06 DIAGNOSIS — M1712 Unilateral primary osteoarthritis, left knee: Secondary | ICD-10-CM | POA: Diagnosis not present

## 2018-05-27 DIAGNOSIS — Z4789 Encounter for other orthopedic aftercare: Secondary | ICD-10-CM | POA: Diagnosis not present

## 2018-05-27 DIAGNOSIS — R600 Localized edema: Secondary | ICD-10-CM | POA: Diagnosis not present

## 2018-05-27 DIAGNOSIS — M25562 Pain in left knee: Secondary | ICD-10-CM | POA: Diagnosis not present

## 2018-06-27 DIAGNOSIS — Z1379 Encounter for other screening for genetic and chromosomal anomalies: Secondary | ICD-10-CM | POA: Insufficient documentation

## 2018-06-27 NOTE — Progress Notes (Signed)
[facility]  Telephone:([phone] [phone]  HEMATOLOGY-ONCOLOGY TELEMEDICINE VISIT PROGRESS NOTE  ID: Wille Glaser OB: 1961/02/18  MR#: [id number]  ENI#:[id number]  Patient Care Team: Marinda Elk, MD as PCP - General (Family Medicine)  I connected with@ on 06/29/18 at  9:15 AM EDT by video enabled telemedicine visit and verified that I am speaking with the correct person using two identifiers.   I discussed the limitations, risks, security and privacy concerns of performing an evaluation and management service by telemedicine and the availability of in-person appointments. I also discussed with the patient that there may be a patient responsible charge related to this service. The patient expressed understanding and agreed to proceed.  Other persons participating in the visit and their role in the encounter: Patient, MD  PATIENT'S LOCATION: Home PROVIDER'S LOCATION: Clinic  CHIEF COMPLAINT: Genetic testing  INTERVAL HISTORY: Patient is a 59 year old female who agreed to video enabled telemedicine visit to discuss genetic testing and counseling.  She has a family history significant for breast cancer with her mother having breast cancer twice at the age of 34 and then again in her late 44s.  Patient also had 2 maternal aunts with breast cancer one deceased in her 69s and a second of unknown age of diagnosis.  She has no other evidence of malignancy in her family.  She currently feels well and is asymptomatic.  She has no neurologic complaints.  She denies any recent fevers or illnesses.  She has a good appetite and denies weight loss.  She has no chest pain, shortness of breath, cough, or hemoptysis.  She has no urinary complaints.  Patient feels at her baseline offers no specific complaints today.    REVIEW OF SYSTEMS:   Review of Systems  Constitutional: Negative.  Negative for fever, malaise/fatigue and weight loss.  Respiratory: Negative.  Negative  for cough, hemoptysis and shortness of breath.   Cardiovascular: Negative.  Negative for chest pain and leg swelling.  Gastrointestinal: Negative.  Negative for abdominal pain.  Genitourinary: Negative.  Negative for dysuria.  Musculoskeletal: Negative.  Negative for back pain.  Skin: Negative.  Negative for rash.  Neurological: Negative.  Negative for dizziness, focal weakness and weakness.  Psychiatric/Behavioral: Negative.  The patient is not nervous/anxious.     As per HPI. Otherwise, a complete review of systems is negative.  PAST MEDICAL HISTORY: Past Medical History:  Diagnosis Date  . Arthritis    right great toe  . Esophageal spasm   . GERD (gastroesophageal reflux disease)   . Wears contact lenses     PAST SURGICAL HISTORY: Past Surgical History:  Procedure Laterality Date  . AUGMENTATION MAMMAPLASTY Bilateral 2001   retro pec saline  . BREAST ENHANCEMENT SURGERY  2005  . COLONOSCOPY    . ESOPHAGOGASTRODUODENOSCOPY (EGD) WITH PROPOFOL N/A 08/30/2015   Procedure: ESOPHAGOGASTRODUODENOSCOPY (EGD) WITH PROPOFOL;  Surgeon: Midge Minium, MD;  Location: [facility];  Service: Endoscopy;  Laterality: N/A;  . SHOULDER ARTHROSCOPY WITH SUBACROMIAL DECOMPRESSION, ROTATOR CUFF REPAIR AND BICEP TENDON REPAIR  02/02/2012   Procedure: SHOULDER ARTHROSCOPY WITH SUBACROMIAL DECOMPRESSION, ROTATOR CUFF REPAIR AND BICEP TENDON REPAIR;  Surgeon: Mable Paris, MD;  Location: [facility];  Service: Orthopedics;  Laterality: Left;  Possible Bicep Tendondesis, open tenodesis    FAMILY HISTORY: History reviewed. No pertinent family history.  ADVANCED DIRECTIVES (Y/N):  N  HEALTH MAINTENANCE: Social History   Tobacco Use  . Smoking status: Never Smoker  . Smokeless tobacco:  Never Used  Substance Use Topics  . Alcohol use: Yes    Alcohol/week: 1.0 standard drinks    Types: 1 Glasses of wine per week    Comment:    . Drug use: No     Colonoscopy:   PAP:  Bone density:  Lipid panel:  No Known Allergies  Current Outpatient Medications  Medication Sig Dispense Refill  . ALPRAZolam (XANAX) 0.5 MG tablet Take 1 tablet by mouth.    . DULoxetine (CYMBALTA) 60 MG capsule Take 60 mg by mouth daily.    Marland Kitchen zolpidem (AMBIEN) 5 MG tablet Take 5 mg by mouth at bedtime as needed for sleep.    . fluticasone (FLONASE) 50 MCG/ACT nasal spray Place into both nostrils daily.    Marland Kitchen loratadine (CLARITIN) 10 MG tablet Take 10 mg by mouth daily.    . valACYclovir (VALTREX) 500 MG tablet TAKE 1 TABLET EVERY 24 HRS ORALLY 30  11   Current Facility-Administered Medications  Medication Dose Route Frequency Provider Last Rate Last Dose  . ipratropium-albuterol (DUONEB) 0.5-2.5 (3) MG/3ML nebulizer solution 3 mL  3 mL Nebulization Q6H Joni Reining, PA-C   3 mL at 03/20/16 1610    OBJECTIVE: There were no vitals filed for this visit.   There is no height or weight on file to calculate BMI.    ECOG FS:0 - Asymptomatic  General: Well-developed, well-nourished, no acute distress. HEENT: Normocephalic, moist mucous membranes. Neuro: Alert, answering all questions appropriately. Cranial nerves grossly intact. Skin: No rashes or petechiae noted. Psych: Normal affect.  LAB RESULTS:  Lab Results  Component Value Date   NA 141 09/18/2015   K 3.5 09/18/2015   CL 104 09/18/2015   CO2 26 09/18/2015   GLUCOSE 112 (H) 09/18/2015   BUN 19 09/18/2015   CREATININE 0.87 09/18/2015   CALCIUM 9.4 09/18/2015   GFRNONAA >60 09/18/2015   GFRAA >60 09/18/2015    Lab Results  Component Value Date   WBC 5.9 09/18/2015   HGB 14.3 09/18/2015   HCT 42.1 09/18/2015   MCV 93.3 09/18/2015   PLT 219 09/18/2015     STUDIES: No results found.  ASSESSMENT: Genetic testing  PLAN:   1. Genetic testing: Patient has no personal history of malignancy.  She has 1 sister who underwent genetic testing and was reported as negative.  She has a family history significant for  breast cancer with her mother having breast cancer twice at the age of 30 and then again in her late 78s.  Patient also had 2 maternal aunts with breast cancer one deceased in her 26s and a second of unknown age of diagnosis.  She has no other evidence of malignancy in her family.  Based on her family history, patient should qualify for genetic testing and will return to the clinic later this week for laboratory work only.  If her results are negative, no further follow-up is necessary and patient has been instructed to continue her yearly screening mammograms as scheduled.  If patient has a positive mutation, she will have a second video enabled tele-visit to discuss the results and any prophylactic measures that can be taken.  I discussed the assessment and treatment plan with the patient. The patient was provided an opportunity to ask questions and all were answered. The patient agreed with the plan and demonstrated an understanding of the instructions.   The patient was advised to call back or seek an in-person evaluation if the symptoms worsen or if the  condition fails to improve as anticipated.  I provided 45 minutes of face-to-face video visit time during this encounter, and > 50% was spent counseling as documented under my assessment & plan.  Jeralyn Ruthsimothy J Aislynn Cifelli, MD 06/29/2018 1:19 PM

## 2018-06-29 ENCOUNTER — Other Ambulatory Visit: Payer: Self-pay

## 2018-06-29 ENCOUNTER — Inpatient Hospital Stay: Payer: 59 | Attending: Oncology | Admitting: Oncology

## 2018-06-29 ENCOUNTER — Encounter: Payer: Self-pay | Admitting: Oncology

## 2018-06-29 DIAGNOSIS — Z803 Family history of malignant neoplasm of breast: Secondary | ICD-10-CM

## 2018-06-29 DIAGNOSIS — Z1379 Encounter for other screening for genetic and chromosomal anomalies: Secondary | ICD-10-CM

## 2018-06-29 NOTE — Progress Notes (Signed)
Patient is establishing care for genetics.

## 2018-06-30 ENCOUNTER — Inpatient Hospital Stay: Payer: 59

## 2018-06-30 ENCOUNTER — Other Ambulatory Visit: Payer: Self-pay

## 2018-06-30 DIAGNOSIS — Z1379 Encounter for other screening for genetic and chromosomal anomalies: Secondary | ICD-10-CM | POA: Diagnosis not present

## 2018-07-27 DIAGNOSIS — F411 Generalized anxiety disorder: Secondary | ICD-10-CM | POA: Diagnosis not present

## 2018-07-27 DIAGNOSIS — G4700 Insomnia, unspecified: Secondary | ICD-10-CM | POA: Diagnosis not present

## 2018-09-22 DIAGNOSIS — F411 Generalized anxiety disorder: Secondary | ICD-10-CM | POA: Diagnosis not present

## 2018-09-22 DIAGNOSIS — G4700 Insomnia, unspecified: Secondary | ICD-10-CM | POA: Diagnosis not present

## 2018-11-05 DIAGNOSIS — G4700 Insomnia, unspecified: Secondary | ICD-10-CM | POA: Diagnosis not present

## 2018-11-05 DIAGNOSIS — F411 Generalized anxiety disorder: Secondary | ICD-10-CM | POA: Diagnosis not present

## 2018-11-25 DIAGNOSIS — H5213 Myopia, bilateral: Secondary | ICD-10-CM | POA: Diagnosis not present

## 2018-11-25 DIAGNOSIS — Z973 Presence of spectacles and contact lenses: Secondary | ICD-10-CM | POA: Diagnosis not present

## 2018-11-25 DIAGNOSIS — H524 Presbyopia: Secondary | ICD-10-CM | POA: Diagnosis not present

## 2018-12-22 DIAGNOSIS — M67912 Unspecified disorder of synovium and tendon, left shoulder: Secondary | ICD-10-CM | POA: Diagnosis not present

## 2018-12-24 DIAGNOSIS — F411 Generalized anxiety disorder: Secondary | ICD-10-CM | POA: Diagnosis not present

## 2018-12-24 DIAGNOSIS — G4700 Insomnia, unspecified: Secondary | ICD-10-CM | POA: Diagnosis not present

## 2019-01-24 DIAGNOSIS — M7502 Adhesive capsulitis of left shoulder: Secondary | ICD-10-CM | POA: Diagnosis not present

## 2019-02-14 DIAGNOSIS — F411 Generalized anxiety disorder: Secondary | ICD-10-CM | POA: Diagnosis not present

## 2019-02-14 DIAGNOSIS — G4700 Insomnia, unspecified: Secondary | ICD-10-CM | POA: Diagnosis not present

## 2019-02-14 DIAGNOSIS — M7502 Adhesive capsulitis of left shoulder: Secondary | ICD-10-CM | POA: Diagnosis not present

## 2019-03-04 DIAGNOSIS — L700 Acne vulgaris: Secondary | ICD-10-CM | POA: Diagnosis not present

## 2019-04-08 DIAGNOSIS — G4700 Insomnia, unspecified: Secondary | ICD-10-CM | POA: Diagnosis not present

## 2019-04-08 DIAGNOSIS — F411 Generalized anxiety disorder: Secondary | ICD-10-CM | POA: Diagnosis not present

## 2019-05-03 ENCOUNTER — Other Ambulatory Visit: Payer: Self-pay | Admitting: Nurse Practitioner

## 2019-05-03 DIAGNOSIS — A609 Anogenital herpesviral infection, unspecified: Secondary | ICD-10-CM | POA: Diagnosis not present

## 2019-05-03 DIAGNOSIS — N907 Vulvar cyst: Secondary | ICD-10-CM | POA: Diagnosis not present

## 2019-05-03 DIAGNOSIS — Z01419 Encounter for gynecological examination (general) (routine) without abnormal findings: Secondary | ICD-10-CM | POA: Diagnosis not present

## 2019-06-03 DIAGNOSIS — L700 Acne vulgaris: Secondary | ICD-10-CM | POA: Diagnosis not present

## 2019-06-03 DIAGNOSIS — L309 Dermatitis, unspecified: Secondary | ICD-10-CM | POA: Diagnosis not present

## 2019-06-03 DIAGNOSIS — L821 Other seborrheic keratosis: Secondary | ICD-10-CM | POA: Diagnosis not present

## 2019-06-10 ENCOUNTER — Other Ambulatory Visit: Payer: Self-pay | Admitting: Psychiatry

## 2019-06-10 DIAGNOSIS — G4700 Insomnia, unspecified: Secondary | ICD-10-CM | POA: Diagnosis not present

## 2019-06-10 DIAGNOSIS — F411 Generalized anxiety disorder: Secondary | ICD-10-CM | POA: Diagnosis not present

## 2019-10-30 IMAGING — [facility] DIGITAL DIAGNOSTIC UNILATERAL RIGHT MAMMOGRAM WITH IMPLANTS, CAD
8 series · 8 of 20 positions shown · non-contrast
Comparison: Previous exam(s).

CLINICAL DATA: Patient complains of a palpable abnormality in the
right breast.

EXAM:
DIGITAL DIAGNOSTIC RIGHT MAMMOGRAM WITH IMPLANTS, CAD AND TOMO
ULTRASOUND RIGHT BREAST
The patient has retropectoral implants. Standard and implant
displaced views were performed.

[R MLO]
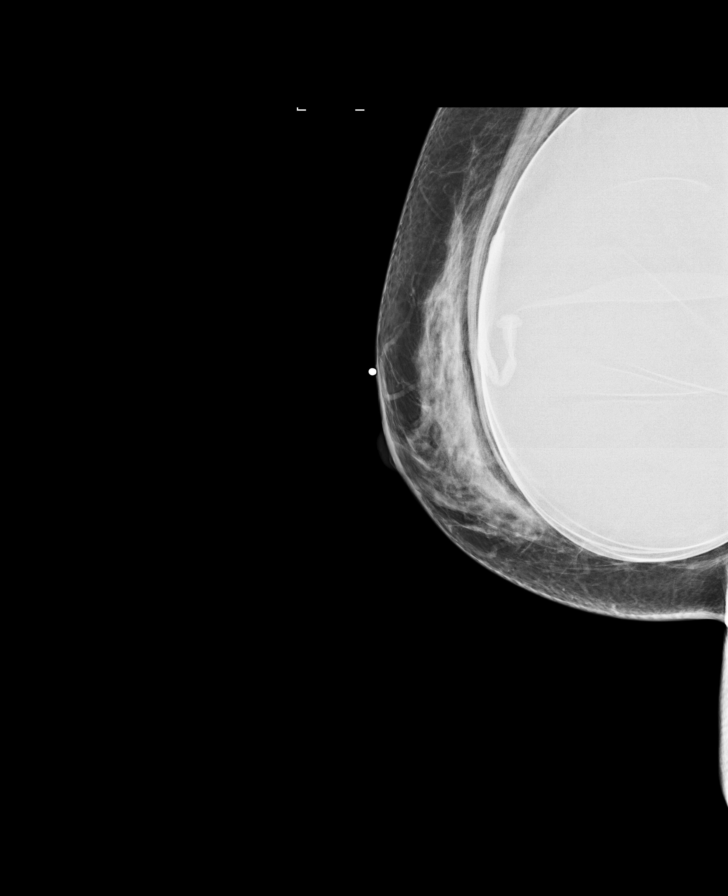

[R CC]
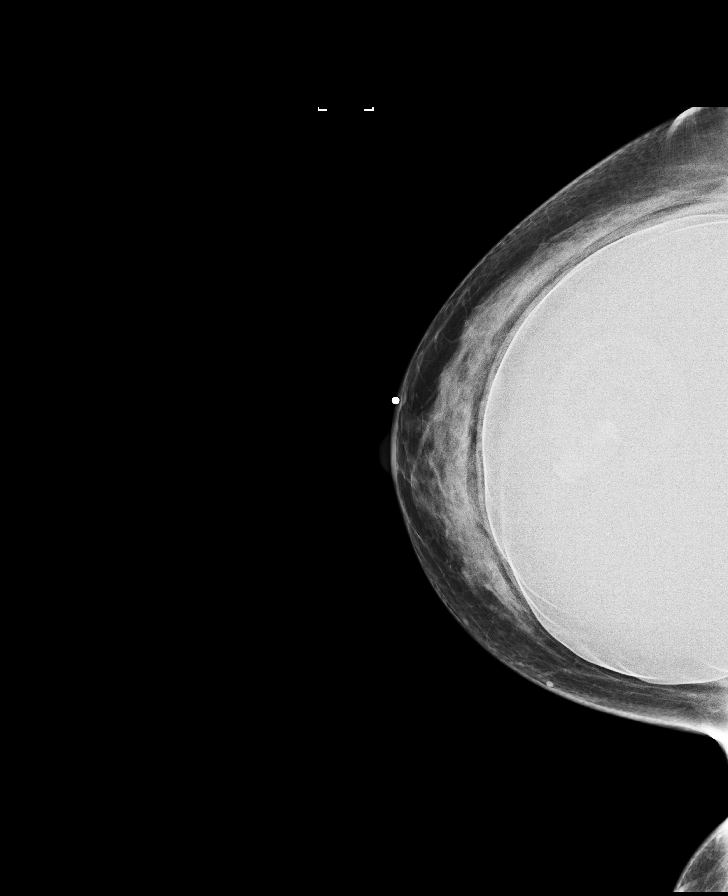

[R MLO synth-2D]
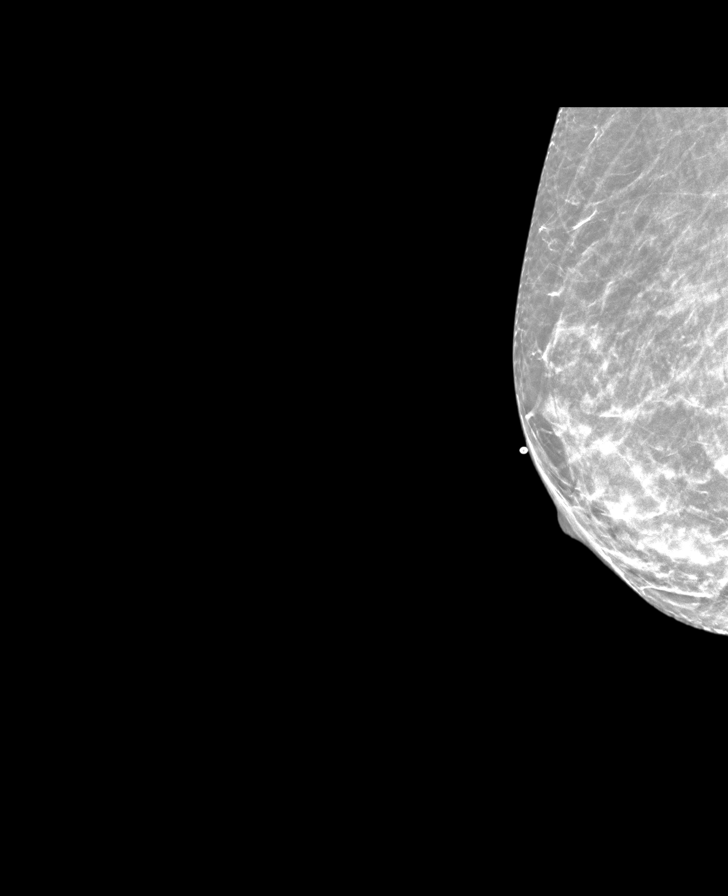

[R TAN synth-2D]
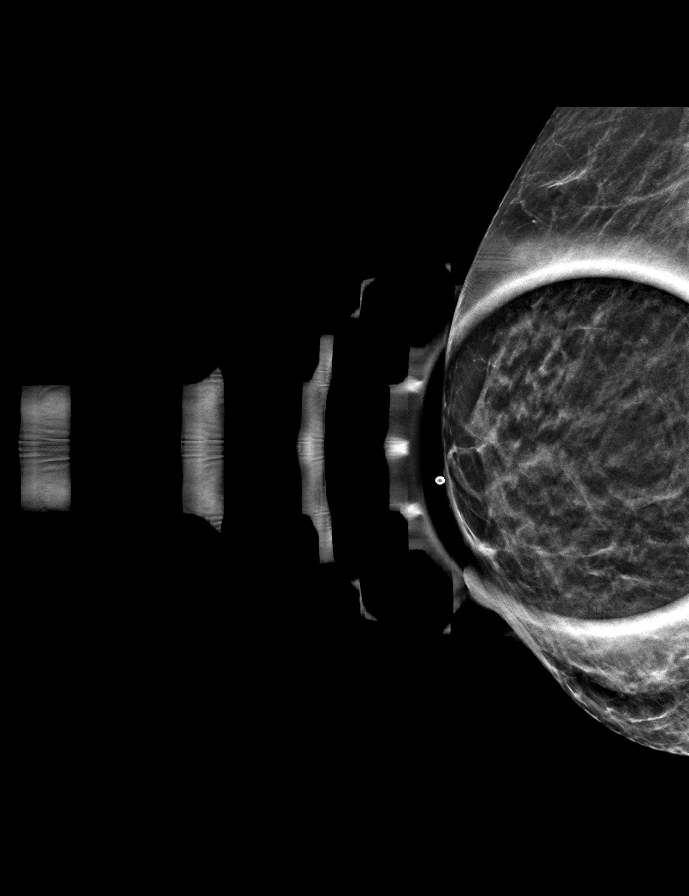

[R CC synth-2D]
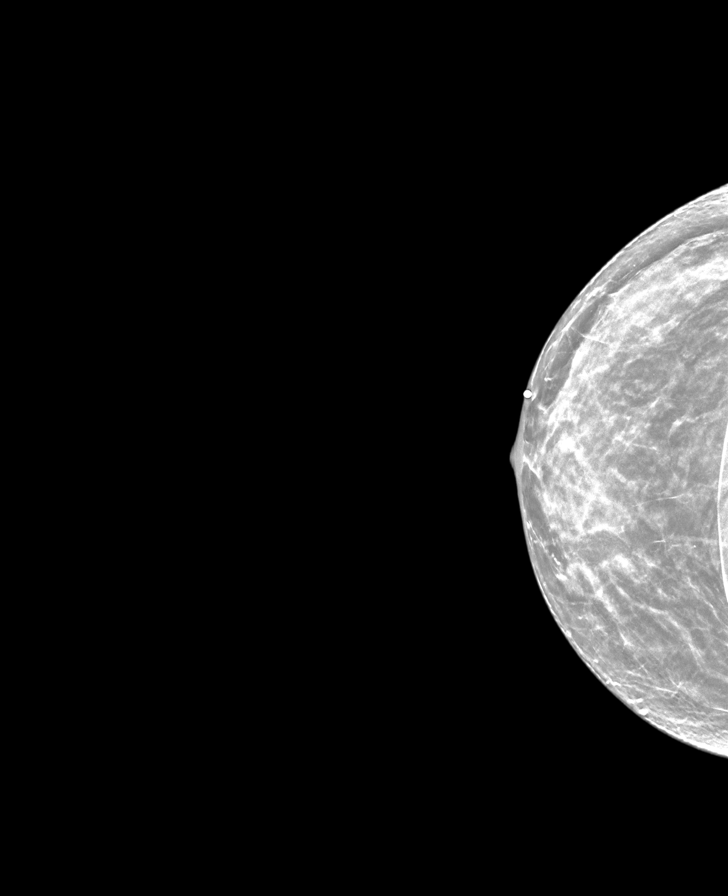

[R TAN tomo · tomo slice 21/42.0]
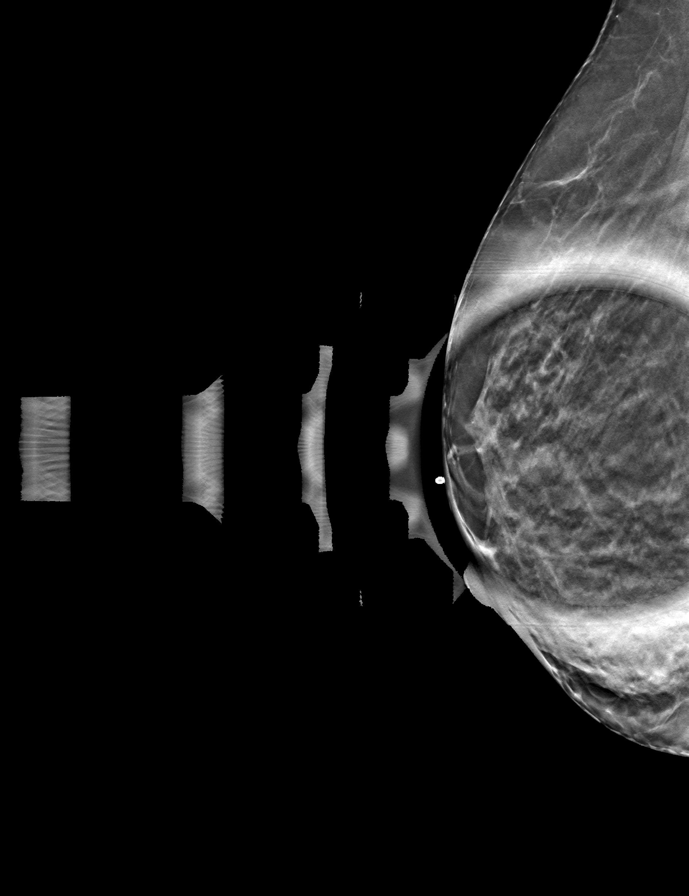

[R CCID BREAST TOMOSYNTHESIS IMAGE tomo · tomo slice 25/48.0]
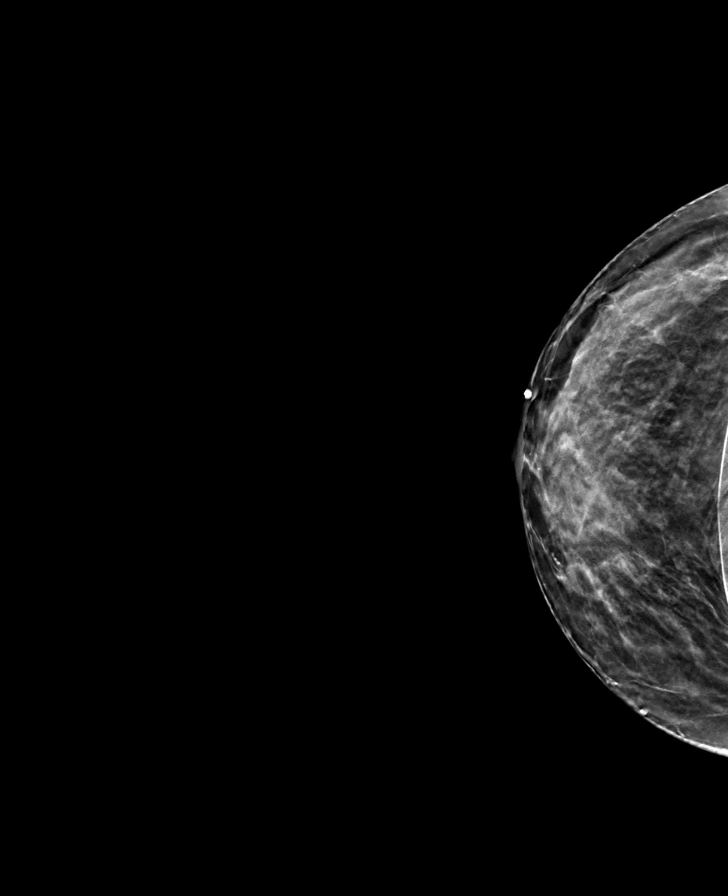

[R MLOID BREAST TOMOSYNTHESIS IMAGE tomo · tomo slice 25/49.0]
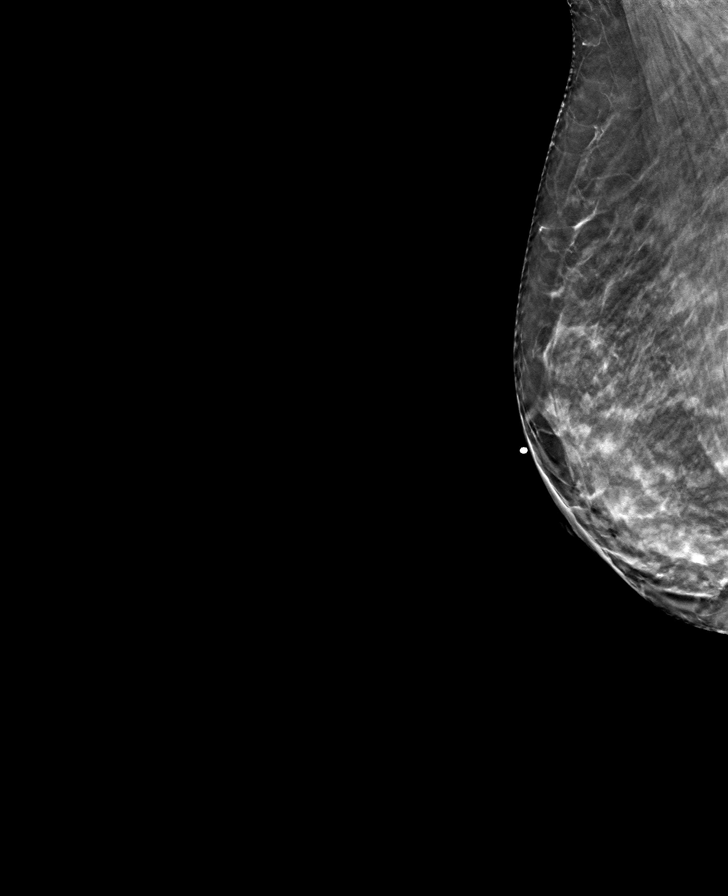

[8 of 20 positions shown; findings below may reference images not displayed]

ACR Breast Density Category c: The breast tissue is heterogeneously
dense, which may obscure small masses.
FINDINGS: No suspicious mass, malignant type microcalcifications or distortion
detected in the right breast.

On physical exam, I palpate a pea-sized nodule in the right breast
at 11 o'clock in the subareolar location.

Targeted ultrasound is performed, showing a simple cyst in the
dermis of the right breast at 11 o'clock in the subareolar location
measuring 4 x 2 x 4 mm. This is consistent with a sebaceous cyst.

Mammographic images were processed with CAD.
IMPRESSION: Sebaceous cyst in the right breast accounting for the palpable
abnormality. No evidence of malignancy.

RECOMMENDATION:
Bilateral screening mammogram in Monday February, 2019 is recommended.

I have discussed the findings and recommendations with the patient.
Results were also provided in writing at the conclusion of the
visit. If applicable, a reminder letter will be sent to the patient
regarding the next appointment.

BI-RADS CATEGORY  2: Benign.

## 2019-11-04 DIAGNOSIS — G4700 Insomnia, unspecified: Secondary | ICD-10-CM | POA: Diagnosis not present

## 2019-11-04 DIAGNOSIS — F411 Generalized anxiety disorder: Secondary | ICD-10-CM | POA: Diagnosis not present

## 2019-11-22 ENCOUNTER — Other Ambulatory Visit: Payer: Self-pay | Admitting: Psychiatry

## 2019-12-01 DIAGNOSIS — H524 Presbyopia: Secondary | ICD-10-CM | POA: Diagnosis not present

## 2019-12-01 DIAGNOSIS — H5213 Myopia, bilateral: Secondary | ICD-10-CM | POA: Diagnosis not present

## 2019-12-01 DIAGNOSIS — Z973 Presence of spectacles and contact lenses: Secondary | ICD-10-CM | POA: Diagnosis not present

## 2019-12-16 DIAGNOSIS — F411 Generalized anxiety disorder: Secondary | ICD-10-CM | POA: Diagnosis not present

## 2019-12-16 DIAGNOSIS — F330 Major depressive disorder, recurrent, mild: Secondary | ICD-10-CM | POA: Diagnosis not present

## 2019-12-30 DIAGNOSIS — G4700 Insomnia, unspecified: Secondary | ICD-10-CM | POA: Diagnosis not present

## 2019-12-30 DIAGNOSIS — F411 Generalized anxiety disorder: Secondary | ICD-10-CM | POA: Diagnosis not present

## 2020-02-01 DIAGNOSIS — F411 Generalized anxiety disorder: Secondary | ICD-10-CM | POA: Diagnosis not present

## 2020-02-01 DIAGNOSIS — F330 Major depressive disorder, recurrent, mild: Secondary | ICD-10-CM | POA: Diagnosis not present

## 2020-03-09 ENCOUNTER — Other Ambulatory Visit: Payer: Self-pay | Admitting: Psychiatry

## 2020-03-09 DIAGNOSIS — G4700 Insomnia, unspecified: Secondary | ICD-10-CM | POA: Diagnosis not present

## 2020-03-09 DIAGNOSIS — F411 Generalized anxiety disorder: Secondary | ICD-10-CM | POA: Diagnosis not present

## 2020-04-02 ENCOUNTER — Other Ambulatory Visit (HOSPITAL_COMMUNITY): Payer: Self-pay | Admitting: Psychiatry

## 2020-04-20 DIAGNOSIS — F411 Generalized anxiety disorder: Secondary | ICD-10-CM | POA: Diagnosis not present

## 2020-04-20 DIAGNOSIS — F330 Major depressive disorder, recurrent, mild: Secondary | ICD-10-CM | POA: Diagnosis not present

## 2020-04-21 ENCOUNTER — Other Ambulatory Visit (HOSPITAL_COMMUNITY): Payer: Self-pay | Admitting: Psychiatry

## 2020-04-21 MED FILL — BUPROPION HCL SR 100 MG TAB: 100 | 30 days supply | Qty: 60 | Fill #0

## 2020-04-30 DIAGNOSIS — F330 Major depressive disorder, recurrent, mild: Secondary | ICD-10-CM | POA: Diagnosis not present

## 2020-04-30 DIAGNOSIS — F411 Generalized anxiety disorder: Secondary | ICD-10-CM | POA: Diagnosis not present

## 2020-05-08 DIAGNOSIS — Z01419 Encounter for gynecological examination (general) (routine) without abnormal findings: Secondary | ICD-10-CM | POA: Diagnosis not present

## 2020-05-08 DIAGNOSIS — Z9189 Other specified personal risk factors, not elsewhere classified: Secondary | ICD-10-CM | POA: Diagnosis not present

## 2020-05-09 ENCOUNTER — Other Ambulatory Visit: Payer: Self-pay | Admitting: Nurse Practitioner

## 2020-05-09 ENCOUNTER — Other Ambulatory Visit (HOSPITAL_COMMUNITY): Payer: Self-pay | Admitting: Psychiatry

## 2020-05-09 DIAGNOSIS — Z9189 Other specified personal risk factors, not elsewhere classified: Secondary | ICD-10-CM

## 2020-05-09 DIAGNOSIS — F411 Generalized anxiety disorder: Secondary | ICD-10-CM | POA: Diagnosis not present

## 2020-05-09 DIAGNOSIS — G4700 Insomnia, unspecified: Secondary | ICD-10-CM | POA: Diagnosis not present

## 2020-05-21 MED FILL — buPROPion HCL ER (XL) 150 M: 150 | 30 days supply | Qty: 30 | Fill #0

## 2020-05-26 ENCOUNTER — Other Ambulatory Visit (HOSPITAL_COMMUNITY): Payer: Self-pay

## 2020-05-26 MED FILL — Zolpidem Tartrate Tab 10 MG: ORAL | 90 days supply | Qty: 90 | Fill #0 | Status: AC

## 2020-06-05 DIAGNOSIS — M25551 Pain in right hip: Secondary | ICD-10-CM | POA: Diagnosis not present

## 2020-06-05 DIAGNOSIS — M1611 Unilateral primary osteoarthritis, right hip: Secondary | ICD-10-CM | POA: Diagnosis not present

## 2020-06-14 ENCOUNTER — Other Ambulatory Visit (HOSPITAL_COMMUNITY): Payer: Self-pay

## 2020-07-02 DIAGNOSIS — F411 Generalized anxiety disorder: Secondary | ICD-10-CM | POA: Diagnosis not present

## 2020-07-02 DIAGNOSIS — F330 Major depressive disorder, recurrent, mild: Secondary | ICD-10-CM | POA: Diagnosis not present

## 2020-07-03 ENCOUNTER — Other Ambulatory Visit (HOSPITAL_COMMUNITY): Payer: Self-pay

## 2020-07-03 DIAGNOSIS — M25552 Pain in left hip: Secondary | ICD-10-CM | POA: Diagnosis not present

## 2020-07-03 MED ORDER — MELOXICAM 15 MG PO TABS
1.0000 | ORAL_TABLET | Freq: Every day | ORAL | 2 refills | Status: AC
  Filled 2020-07-03: qty 30, 30d supply, fill #0
  Filled 2020-08-28: qty 30, 30d supply, fill #1

## 2020-07-05 ENCOUNTER — Other Ambulatory Visit (HOSPITAL_COMMUNITY): Payer: Self-pay

## 2020-07-05 MED FILL — Alprazolam Tab 0.5 MG: ORAL | 90 days supply | Qty: 180 | Fill #0 | Status: AC

## 2020-07-10 DIAGNOSIS — M1612 Unilateral primary osteoarthritis, left hip: Secondary | ICD-10-CM | POA: Diagnosis not present

## 2020-07-13 ENCOUNTER — Other Ambulatory Visit (HOSPITAL_COMMUNITY): Payer: Self-pay

## 2020-07-13 DIAGNOSIS — G4700 Insomnia, unspecified: Secondary | ICD-10-CM | POA: Diagnosis not present

## 2020-07-13 DIAGNOSIS — F411 Generalized anxiety disorder: Secondary | ICD-10-CM | POA: Diagnosis not present

## 2020-07-13 MED ORDER — DULOXETINE HCL 60 MG PO CPEP
60.0000 mg | ORAL_CAPSULE | Freq: Every day | ORAL | 1 refills | Status: AC
  Filled 2020-07-13: qty 90, 90d supply, fill #0
  Filled 2020-10-18: qty 90, 90d supply, fill #1

## 2020-07-13 MED ORDER — ALPRAZOLAM 0.5 MG PO TABS
0.5000 mg | ORAL_TABLET | Freq: Two times a day (BID) | ORAL | 1 refills | Status: AC | PRN
  Filled 2020-07-13 – 2020-11-12 (×2): qty 180, 90d supply, fill #0

## 2020-07-13 MED ORDER — ZOLPIDEM TARTRATE 10 MG PO TABS: 10.0000 mg | ORAL_TABLET | Freq: Every evening | ORAL | 1 refills | Status: AC | PRN

## 2020-07-20 ENCOUNTER — Other Ambulatory Visit (HOSPITAL_COMMUNITY): Payer: Self-pay

## 2020-07-30 DIAGNOSIS — F411 Generalized anxiety disorder: Secondary | ICD-10-CM | POA: Diagnosis not present

## 2020-07-30 DIAGNOSIS — F330 Major depressive disorder, recurrent, mild: Secondary | ICD-10-CM | POA: Diagnosis not present

## 2020-07-31 ENCOUNTER — Other Ambulatory Visit (HOSPITAL_COMMUNITY): Payer: Self-pay

## 2020-07-31 MED ORDER — VALACYCLOVIR HCL 500 MG PO TABS
ORAL_TABLET | ORAL | 3 refills | Status: AC
  Filled 2020-07-31: qty 90, 90d supply, fill #0
  Filled 2020-12-03 – 2020-12-13 (×2): qty 90, 90d supply, fill #1
  Filled 2021-06-26: qty 90, 90d supply, fill #2

## 2020-08-28 ENCOUNTER — Other Ambulatory Visit (HOSPITAL_COMMUNITY): Payer: Self-pay

## 2020-08-28 MED FILL — Zolpidem Tartrate Tab 10 MG: ORAL | 90 days supply | Qty: 90 | Fill #1 | Status: AC

## 2020-09-03 ENCOUNTER — Other Ambulatory Visit: Payer: Self-pay | Admitting: Obstetrics and Gynecology

## 2020-09-03 DIAGNOSIS — Z1231 Encounter for screening mammogram for malignant neoplasm of breast: Secondary | ICD-10-CM

## 2020-09-04 ENCOUNTER — Inpatient Hospital Stay: Admission: RE | Admit: 2020-09-04 | Payer: 59 | Source: Ambulatory Visit

## 2020-09-05 ENCOUNTER — Ambulatory Visit
Admission: RE | Admit: 2020-09-05 | Discharge: 2020-09-05 | Disposition: A | Discharge status: Home / Self Care | Payer: 59 | Source: Ambulatory Visit | Attending: Obstetrics and Gynecology | Admitting: Obstetrics and Gynecology

## 2020-09-05 DIAGNOSIS — Z1231 Encounter for screening mammogram for malignant neoplasm of breast: Secondary | ICD-10-CM | POA: Diagnosis not present

## 2020-09-10 DIAGNOSIS — F411 Generalized anxiety disorder: Secondary | ICD-10-CM | POA: Diagnosis not present

## 2020-09-10 DIAGNOSIS — F330 Major depressive disorder, recurrent, mild: Secondary | ICD-10-CM | POA: Diagnosis not present

## 2020-09-13 DIAGNOSIS — M1611 Unilateral primary osteoarthritis, right hip: Secondary | ICD-10-CM | POA: Diagnosis not present

## 2020-09-13 DIAGNOSIS — M1612 Unilateral primary osteoarthritis, left hip: Secondary | ICD-10-CM | POA: Diagnosis not present

## 2020-10-01 DIAGNOSIS — R030 Elevated blood-pressure reading, without diagnosis of hypertension: Secondary | ICD-10-CM | POA: Diagnosis not present

## 2020-10-01 DIAGNOSIS — Z419 Encounter for procedure for purposes other than remedying health state, unspecified: Secondary | ICD-10-CM | POA: Diagnosis not present

## 2020-10-01 DIAGNOSIS — Z01818 Encounter for other preprocedural examination: Secondary | ICD-10-CM | POA: Diagnosis not present

## 2020-10-08 DIAGNOSIS — R799 Abnormal finding of blood chemistry, unspecified: Secondary | ICD-10-CM | POA: Diagnosis not present

## 2020-10-08 DIAGNOSIS — R718 Other abnormality of red blood cells: Secondary | ICD-10-CM | POA: Diagnosis not present

## 2020-10-09 DIAGNOSIS — F411 Generalized anxiety disorder: Secondary | ICD-10-CM | POA: Diagnosis not present

## 2020-10-09 DIAGNOSIS — G4700 Insomnia, unspecified: Secondary | ICD-10-CM | POA: Diagnosis not present

## 2020-10-10 ENCOUNTER — Other Ambulatory Visit (HOSPITAL_COMMUNITY): Payer: Self-pay

## 2020-10-10 DIAGNOSIS — M1611 Unilateral primary osteoarthritis, right hip: Secondary | ICD-10-CM | POA: Diagnosis not present

## 2020-10-10 MED ORDER — CELECOXIB 200 MG PO CAPS
200.0000 mg | ORAL_CAPSULE | Freq: Two times a day (BID) | ORAL | 0 refills | Status: AC
  Filled 2020-10-10 – 2020-10-19 (×2): qty 60, 30d supply, fill #0

## 2020-10-10 MED ORDER — OXYCODONE HCL 5 MG PO TABS
ORAL_TABLET | ORAL | 0 refills | Status: AC
  Filled 2020-10-10 – 2020-10-19 (×2): qty 30, 5d supply, fill #0

## 2020-10-10 MED ORDER — ASPIRIN EC 81 MG PO TBEC: DELAYED_RELEASE_TABLET | ORAL | 0 refills | Status: AC

## 2020-10-10 MED ORDER — ONDANSETRON HCL 4 MG PO TABS
ORAL_TABLET | ORAL | 0 refills | Status: AC
  Filled 2020-10-10: qty 30, 8d supply, fill #0
  Filled 2020-10-19: qty 30, 7d supply, fill #0

## 2020-10-10 MED ORDER — TIZANIDINE HCL 2 MG PO TABS
ORAL_TABLET | ORAL | 0 refills | Status: AC
  Filled 2020-10-10: qty 60, 15d supply, fill #0
  Filled 2020-10-19: qty 60, 7d supply, fill #0

## 2020-10-11 ENCOUNTER — Other Ambulatory Visit (HOSPITAL_COMMUNITY): Payer: Self-pay

## 2020-10-15 ENCOUNTER — Other Ambulatory Visit: Payer: Self-pay

## 2020-10-15 ENCOUNTER — Ambulatory Visit: Payer: 59 | Attending: Family Medicine | Admitting: Physical Therapy

## 2020-10-15 ENCOUNTER — Encounter: Payer: Self-pay | Admitting: Physical Therapy

## 2020-10-15 DIAGNOSIS — M25551 Pain in right hip: Secondary | ICD-10-CM

## 2020-10-15 DIAGNOSIS — M6281 Muscle weakness (generalized): Secondary | ICD-10-CM

## 2020-10-15 DIAGNOSIS — R2689 Other abnormalities of gait and mobility: Secondary | ICD-10-CM

## 2020-10-15 NOTE — Therapy (Signed)
[facility] [address], [address] [place], [place], [address] Phone: [phone]   Fax:  [phone]  Physical Therapy Evaluation  Patient Details  Name: Erin Lucero MRN: [id number] Date of Birth: [birth year] Referring Provider (PT): Marinda Elk, MD   Encounter Date: 10/15/2020   PT End of Session - 10/15/20 1649     Visit Number 1    Date for PT Re-Evaluation 12/10/20    Authorization Type [employer]    PT Start Time 1558    PT Stop Time 1630    PT Time Calculation (min) 32 min    Activity Tolerance Patient tolerated treatment well    Behavior During Therapy [facility] for tasks assessed/performed             Past Medical History:  Diagnosis Date   Arthritis    right great toe   Esophageal spasm    GERD (gastroesophageal reflux disease)    Wears contact lenses     Past Surgical History:  Procedure Laterality Date   AUGMENTATION MAMMAPLASTY Bilateral 2001   retro pec saline   BREAST ENHANCEMENT SURGERY  2005   COLONOSCOPY     ESOPHAGOGASTRODUODENOSCOPY (EGD) WITH PROPOFOL N/A 08/30/2015   Procedure: ESOPHAGOGASTRODUODENOSCOPY (EGD) WITH PROPOFOL;  Surgeon: Midge Minium, MD;  Location: [facility];  Service: Endoscopy;  Laterality: N/A;   SHOULDER ARTHROSCOPY WITH SUBACROMIAL DECOMPRESSION, ROTATOR CUFF REPAIR AND BICEP TENDON REPAIR  02/02/2012   Procedure: SHOULDER ARTHROSCOPY WITH SUBACROMIAL DECOMPRESSION, ROTATOR CUFF REPAIR AND BICEP TENDON REPAIR;  Surgeon: Mable Paris, MD;  Location: [facility];  Service: Orthopedics;  Laterality: Left;  Possible Bicep Tendondesis, open tenodesis    There were no vitals filed for this visit.    Subjective Assessment - 10/15/20 1603     Subjective Patient presenting due to 1.5 year history of Rt hip pain. She states that symptoms began with grabbing pain with pivoting or turning. Pain has progressed to constant. Has Lt hip pain as  well but Rt is significanlty worse. Had injection which helped x3 months but pain has since returned. 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 Is scheduled for Rt THA using anterior approach 10/19/2020.    Limitations Walking;Standing    How long can you sit comfortably? 30 minutes    How long can you stand comfortably? 15 minutes    How long can you walk comfortably? 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 Yes    Pain Score 3    10/10 at worst   Pain Location Hip    Pain Orientation Right    Pain Descriptors / Indicators Stabbing    Pain Type Chronic pain    Pain Onset More than a month ago    Pain Frequency Intermittent    Aggravating Factors  stairs, adduction, prolonged walking, prolonged standing    Pain Relieving Factors anti inflammatories                [facility] PT Assessment - 10/15/20 0001       Assessment   Medical Diagnosis S/P R Ant Total hip replacement Prehab assessment and education for [facility]    Referring Provider (PT) Marinda Elk, MD    Onset Date/Surgical Date 10/19/20    Hand Dominance Right    Next MD Visit 10/19/2020    Prior Therapy Yes  Precautions   Precautions Anterior Hip      Restrictions   Weight Bearing Restrictions No      Balance Screen   Has the patient fallen in the past 6 months No    Has the patient had a decrease in activity level because of a fear of falling?  No    Is the patient reluctant to leave their home because of a fear of falling?  No      Home Nurse, mental healthnvironment   Living Environment Private residence    Living Arrangements Spouse/significant other;Children    Type of Home House    Home Layout One level;Able to live on main level with bedroom/bathroom      Prior Function   Level of Independence Independent    Vocation Requirements Nurse and fitness instructor    Leisure fitness instruction      Cognition   Overall Cognitive Status  Within Functional Limits for tasks assessed      Observation/Other Assessments   Focus on Therapeutic Outcomes (FOTO)  to be assessed post op      Posture/Postural Control   Posture/Postural Control Postural limitations    Postural Limitations Decreased lumbar lordosis;Weight shift left      ROM / Strength   AROM / PROM / Strength AROM;Strength      AROM   AROM Assessment Site Hip    Right Hip Flexion 116    Right Hip External Rotation  21    Right Hip Internal Rotation  7    Left Hip External Rotation  28    Left Hip Internal Rotation  30      Strength   Strength Assessment Site Hip;Knee;Ankle    Right/Left Hip Right;Left    Right Hip Flexion 4/5    Right Hip Extension 4+/5    Right Hip External Rotation  4+/5    Right Hip Internal Rotation 4+/5    Right Hip ABduction 4+/5    Right Hip ADduction 4/5    Left Hip Flexion 4+/5    Left Hip Extension 4/5    Left Hip External Rotation 5/5    Left Hip Internal Rotation 5/5    Left Hip ABduction 4+/5    Left Hip ADduction 5/5    Right/Left Knee Right;Left    Right Knee Flexion 4+/5    Right Knee Extension 5/5    Left Knee Flexion 5/5    Left Knee Extension 5/5    Right Ankle Dorsiflexion 4+/5    Right Ankle Plantar Flexion 5/5    Left Ankle Dorsiflexion 5/5    Left Ankle Plantar Flexion 5/5      Bed Mobility   Bed Mobility Rolling Right;Rolling Left;Sit to Supine;Supine to Sit    Rolling Right Independent    Rolling Left Independent    Supine to Sit Independent    Sit to Supine Independent      Transfers   Transfers Sit to Stand;Stand to Sit    Sit to Stand 7: Independent;With upper extremity assist    Five time sit to stand comments  18.2 seconds    Stand to Sit 7: Independent;With upper extremity assist      Ambulation/Gait   Ambulation/Gait Yes    Ambulation/Gait Assistance 7: Independent    Ambulation Distance (Feet) 50 Feet    Assistive device None    Gait Pattern Step-through pattern;Decreased step length  - right;Decreased stance time - right;Decreased hip/knee flexion - right;Decreased hip/knee flexion - left;Decreased weight shift to right;Antalgic  Ambulation Surface Level                        Objective measurements completed on examination: See above findings.               PT Education - 10/15/20 1637     Education Details use of BSC over commode for improved transfers post op; use of BSC to assist with transfers into/out of shower; removal of area rugs or taping them down to prevent falling; slipper with closed back to prevent falls    Person(s) Educated Patient    Methods Explanation    Comprehension Verbalized understanding              PT Short Term Goals - 10/15/20 1645       PT SHORT TERM GOAL #1   Title Patient will be independent with HEP for continued progression at home.    Time 4    Period Weeks    Status New    Target Date 11/12/20      PT SHORT TERM GOAL #2   Title Patient will demonstration 4+/5 global hip strength to more readily perform functional transfers.    Time 4    Period Weeks    Status New    Target Date 11/12/20      PT SHORT TERM GOAL #3   Title Patient will ambulate x500 feet without AD and with minimal gait deviations for improved household ambulation.    Time 4    Period Weeks    Status New    Target Date 11/12/20               PT Long Term Goals - 10/15/20 1647       PT LONG TERM GOAL #1   Title Patient will be independent with advanced HEP for long term management of symptoms post D/C.    Time 8    Period Weeks    Status New    Target Date 12/10/20      PT LONG TERM GOAL #2   Title Patient will perform complete 5 times sit to stand in 11.5 seconds or less to indicate improved functional mobility and decreased fall risk.    Baseline 18.2 seconds    Time 8    Period Weeks    Status New    Target Date 12/10/20      PT LONG TERM GOAL #3   Title Patient will ascend/descend x12 stairs  using recipocal pattern to more readily nagivate at home.    Time 8    Period Weeks    Status New    Target Date 12/10/20                    Plan - 10/15/20 1638     Clinical Impression Statement Patient is a 60 y/o female presenting due to 1.5 year history of Rt hip pain with Rt THA scheduled for 8/26 using anterior approach. PMH is unremarkable. Patient reported activity limitations include stair negotiation, prolonged ambulation, functional transfers, and prolonged standing. She demonstrates Rt hip strength and AROM impairments. Patient requiring 18.2 seconds to complete 5 times sit to stand indicating impaired functional mobility and increased fall risk. She demos numerous gait deviations including decreased Rt stance time and flexed knee in stance which further increases fall risk. Patient completing supine to sit independently but requiring increased time due to Rt hip pain. She would benefit from skilled therapeutic  intervention to address impairments for decreased pain, improved functional activity tolerance, and improved community accessibility.    Personal Factors and Comorbidities Profession    Examination-Activity Limitations [place];Locomotion Level;Transfers;Stairs;Squat;Stand    Examination-Participation Restrictions Occupation;Community Activity    Stability/Clinical Decision Making Stable/Uncomplicated    Clinical Decision Making Low    Rehab Potential Excellent    PT Frequency 2x / week    PT Duration 8 weeks    PT Treatment/Interventions ADLs/Self Care Home Management;Cryotherapy;Electrical Stimulation;Gait training;Stair training;Functional mobility training;Therapeutic activities;Therapeutic exercise;Balance training;Patient/family education;Manual techniques;Passive range of motion;Dry needling;Joint Manipulations    PT Next Visit Plan reassess post-op; review hospital HEP and alter as needed; LE/hip strengthening; pre-gait/gait activities; Nustep if able     Consulted and Agree with Plan of Care Patient             Patient will benefit from skilled therapeutic intervention in order to improve the following deficits and impairments:  Abnormal gait, Decreased endurance, Decreased activity tolerance, Decreased balance, Decreased range of motion, Decreased strength, Difficulty walking, Increased muscle spasms, Improper body mechanics, Postural dysfunction, Pain  Visit Diagnosis: Pain in right hip - Plan: PT plan of care cert/re-cert  Muscle weakness (generalized) - Plan: PT plan of care cert/re-cert  Other abnormalities of gait and mobility - Plan: PT plan of care cert/re-cert     Problem List Patient Active Problem List   Diagnosis Date Noted   Genetic testing 06/27/2018   S/P left knee arthroscopy 02/09/2018   Problems with swallowing and mastication    Globus hystericus    Gastritis    Hiatal hernia    Anabel Halon PT, DPT  10/15/20 4:52 PM    [facility] [address], [address] [place], [place], [address] Phone: [phone]   Fax:  [phone]  Name: Erin Lucero MRN: [id number] Date of Birth: [birth year]

## 2020-10-18 ENCOUNTER — Other Ambulatory Visit (HOSPITAL_COMMUNITY): Payer: Self-pay

## 2020-10-19 ENCOUNTER — Other Ambulatory Visit (HOSPITAL_COMMUNITY): Payer: Self-pay

## 2020-10-19 DIAGNOSIS — M1611 Unilateral primary osteoarthritis, right hip: Secondary | ICD-10-CM | POA: Diagnosis not present

## 2020-10-22 ENCOUNTER — Encounter: Payer: Self-pay | Admitting: Physical Therapy

## 2020-10-22 ENCOUNTER — Other Ambulatory Visit: Payer: Self-pay

## 2020-10-22 ENCOUNTER — Ambulatory Visit: Payer: 59 | Admitting: Physical Therapy

## 2020-10-22 DIAGNOSIS — R2689 Other abnormalities of gait and mobility: Secondary | ICD-10-CM | POA: Diagnosis not present

## 2020-10-22 DIAGNOSIS — M25551 Pain in right hip: Secondary | ICD-10-CM | POA: Diagnosis not present

## 2020-10-22 DIAGNOSIS — M6281 Muscle weakness (generalized): Secondary | ICD-10-CM

## 2020-10-22 NOTE — Patient Instructions (Signed)
Access Code: [email or web address] URL: [email or web address] Date: 10/22/2020 Prepared by: Loistine Simas Hazleigh Mccleave  Exercises Supine Heel Slide - 3 x daily - 7 x weekly - 1 sets - 20 reps Standing Weight Shift Side to Side - 1 x daily - 7 x weekly - 1 sets - 60 reps Staggered Stance Forward Backward Weight Shift with Counter Support - 1 x daily - 7 x weekly - 1 sets - 60 reps Supine Bridge - 1 x daily - 7 x weekly - 2 sets - 5 reps Supine Bridge with Mini Swiss Ball Between Knees - 1 x daily - 7 x weekly - 2 sets - 5 reps Bridge with Hip Abduction and Resistance - 1 x daily - 7 x weekly - 2 sets - 5 reps Sit to Stand with Armchair - 3 x daily - 7 x weekly - 1 sets - 5 reps Seated Long Arc Quad - 1 x daily - 7 x weekly - 3 sets - 10 reps

## 2020-10-22 NOTE — Therapy (Signed)
[facility] [address] [place], [place], [address] Phone: [phone]   Fax:  [phone]  Physical Therapy Treatment  Patient Details  Name: Erin Lucero MRN: [id number] Date of Birth: 1960-09-27 Referring Provider (PT): Marinda Elk, MD   Encounter Date: 10/22/2020   PT End of Session - 10/22/20 1309     Visit Number 2    Date for PT Re-Evaluation 12/10/20    Authorization Type [employer]    PT Start Time [phone]   Pt late   PT Stop Time 1015    PT Time Calculation (min) 37 min    Activity Tolerance Patient tolerated treatment well    Behavior During Therapy [facility] for tasks assessed/performed             Past Medical History:  Diagnosis Date   Arthritis    right great toe   Esophageal spasm    GERD (gastroesophageal reflux disease)    Wears contact lenses     Past Surgical History:  Procedure Laterality Date   AUGMENTATION MAMMAPLASTY Bilateral 2001   retro pec saline   BREAST ENHANCEMENT SURGERY  2005   COLONOSCOPY     ESOPHAGOGASTRODUODENOSCOPY (EGD) WITH PROPOFOL N/A 08/30/2015   Procedure: ESOPHAGOGASTRODUODENOSCOPY (EGD) WITH PROPOFOL;  Surgeon: Midge Minium, MD;  Location: [facility];  Service: Endoscopy;  Laterality: N/A;   SHOULDER ARTHROSCOPY WITH SUBACROMIAL DECOMPRESSION, ROTATOR CUFF REPAIR AND BICEP TENDON REPAIR  02/02/2012   Procedure: SHOULDER ARTHROSCOPY WITH SUBACROMIAL DECOMPRESSION, ROTATOR CUFF REPAIR AND BICEP TENDON REPAIR;  Surgeon: Mable Paris, MD;  Location: [facility];  Service: Orthopedics;  Laterality: Left;  Possible Bicep Tendondesis, open tenodesis    There were no vitals filed for this visit.   Subjective Assessment - 10/22/20 0939     Subjective Pt is s/p Rt THR anterior approach performed by Dr. Turner Daniels on 10/19/20 (4 days ago.)  She returns for post-op re-eval.  Pt has tried walking without walker but can't yet.  Symptoms  are more stiffness than pain.    Limitations Walking;Standing    How long can you sit comfortably? 1 hour but makes hip stiff    How long can you stand comfortably? 15 min    How long can you walk comfortably? 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 Yes    Pain Score 5     Pain Location Hip    Pain Orientation Right    Pain Descriptors / Indicators Tightness;Dull;Aching    Pain Type Surgical pain    Pain Onset In the past 7 days    Pain Frequency Constant    Pain Relieving Factors extra strength Tylenol, 3x/daily ice x 20'                [facility] PT Assessment - 10/22/20 0001       Assessment   Medical Diagnosis S/P R Ant Total hip replacement Prehab assessment and education for [facility]    Referring Provider (PT) Marinda Elk, MD    Onset Date/Surgical Date 10/19/20    Hand Dominance Right    Next MD Visit 10/19/2020    Prior Therapy Yes      Precautions   Precautions Anterior Hip      Restrictions   Weight Bearing Restrictions No      Balance Screen  Has the patient fallen in the past 6 months No      Home Nurse, mental health Private residence    Living Arrangements Spouse/significant other;Children    Type of Home House    Home Layout One level;Able to live on main level with bedroom/bathroom      Prior Function   Level of Independence Independent    Vocation Requirements Nurse and fitness instructor    Leisure fitness instruction      Observation/Other Assessments   Focus on Therapeutic Outcomes (FOTO)  25% goal 53%      Posture/Postural Control   Posture/Postural Control Postural limitations    Postural Limitations Decreased lumbar lordosis;Weight shift left      ROM / Strength   AROM / PROM / Strength AROM;Strength      AROM   AROM Assessment Site Hip    Right/Left Hip Right;Left    Right Hip Flexion 110    Left Hip  External Rotation  28    Left Hip Internal Rotation  30      Strength   Overall Strength Comments Rt hip at least 3+/5, weak and painless resisted isometrics Rt hip    Strength Assessment Site Hip;Knee    Right/Left Hip Right;Left    Right/Left Knee Right    Right Knee Flexion 4-/5    Right Knee Extension 4-/5      Bed Mobility   Bed Mobility Supine to Sit;Sit to Supine    Supine to Sit Set up assist;Supervision/Verbal cueing;Contact Guard/Touching assist   use Lt foot to hook around Rt LE for assistance cued   Sit to Supine Set up assist;Supervision/Verbal cueing;Contact Guard/Touching assist      Transfers   Transfers Sit to Stand;Stand to Sit    Sit to Stand 6: Modified independent (Device/Increase time);With upper extremity assist    Five time sit to stand comments  25.3 sec    Comments Lt>Rt WB with sit to stand      Ambulation/Gait   Ambulation/Gait Yes    Ambulation/Gait Assistance 6: Modified independent (Device/Increase time)    Assistive device Rolling walker    Gait Pattern Step-to pattern;Step-through pattern;Antalgic;Trendelenburg    Ambulation Surface Level    Gait Comments antalgic gait without RW, improved after weight shifts                           [facility] Adult PT Treatment/Exercise - 10/22/20 0001       Exercises   Exercises Knee/Hip      Knee/Hip Exercises: Standing   Gait Training weight shifting x 1' each square stance and staggered stance Rt/Lt      Knee/Hip Exercises: Seated   Sit to Sand 5 reps;with UE support   VC for more even use of bil LEs     Knee/Hip Exercises: Supine   Short Arc Quad Sets Right;15 reps    Heel Slides Right;15 reps    Bridges Both;Strengthening;5 reps    Bridges with Newman Pies Squeeze Both;5 reps;Strengthening    Bridges with Clamshell Strengthening;Both;5 reps    Other Supine Knee/Hip Exercises quad and glut set combo 10x3"                    PT Education - 10/22/20 1018     Education Details  Access Code: [email or web address]    Person(s) Educated Patient    Methods Explanation;Demonstration;Handout    Comprehension Verbalized understanding;Returned demonstration  PT Short Term Goals - 10/22/20 1318       PT SHORT TERM GOAL #1   Title Patient will be independent with HEP for continued progression at home.    Time 4    Period Weeks    Status New    Target Date 11/12/20      PT SHORT TERM GOAL #2   Title Patient will demonstration 4+/5 global hip strength to more readily perform functional transfers.    Time 4    Period Weeks    Status New    Target Date 11/12/20      PT SHORT TERM GOAL #3   Title Patient will ambulate x500 feet without AD and with minimal gait deviations for improved household ambulation.    Time 4    Period Weeks    Status New    Target Date 11/12/20      PT SHORT TERM GOAL #4   Title Reduce 5x sit to stand to </= 18 sec    Time 4    Period Weeks    Status New    Target Date 11/19/20               PT Long Term Goals - 10/22/20 1319       PT LONG TERM GOAL #1   Title Patient will be independent with advanced HEP for long term management of symptoms post D/C.    Baseline -    Time 8    Period Weeks    Status New    Target Date 12/17/20      PT LONG TERM GOAL #2   Title Patient will perform complete 5 times sit to stand in 11.5 seconds or less to indicate improved functional mobility and decreased fall risk.    Baseline 25 sec post op (18 sec pre-op)    Time 8    Period Weeks    Status New    Target Date 12/17/20      PT LONG TERM GOAL #3   Title Patient will ascend/descend x12 stairs using recipocal pattern to more readily nagivate at home.    Baseline -    Time 8    Period Weeks    Status New    Target Date 12/17/20      PT LONG TERM GOAL #4   Title Improve FOTO score to >/= 53% to demo improved function of Rt hip    Time 8    Period Weeks    Status New    Target Date 12/17/20      PT LONG TERM GOAL #5    Title -                   Plan - 10/22/20 1018     Clinical Impression Statement Pt is 4 days s/p Rt THA with anterior approach.  She presents with gait with RW using step to pattern, sit to stand with UE assist and WB Lt>Rt LE, and weak and painless Rt hip resisted isometrics.  She is using UEs to assist Rt LE with bed transfers.  Hip ROM allows for functional mobility to sit on low mat table.  PT reviewed hospital HEP and progressed as tolerated today.  Pt displayed signif antalgic gait and Trendelenburg without RW but this improved today following pre-gait activities for weight shifting 3-ways.  5x sit to stand today was 25 sec.  Pt will continue to benefit from skilled PT to progress functional mobility, strength and  gait training to maximize post-op function of Rt hip.    Personal Factors and Comorbidities Profession    Examination-Activity Limitations [place];Locomotion Level;Transfers;Stairs;Squat;Stand    Examination-Participation Restrictions Occupation;Community Activity;Driving;Shop    Stability/Clinical Decision Making Stable/Uncomplicated    Clinical Decision Making Low    Rehab Potential Excellent    PT Frequency 2x / week    PT Duration 8 weeks    PT Treatment/Interventions ADLs/Self Care Home Management;Cryotherapy;Electrical Stimulation;Gait training;Stair training;Functional mobility training;Therapeutic activities;Therapeutic exercise;Balance training;Patient/family education;Manual techniques;Passive range of motion;Dry needling;Joint Manipulations    PT Next Visit Plan NuStep, pre-gait activities and gait training, review HEP and progress Rt hip ROM and strength as tol    PT Home Exercise Plan Access Code: [email or web address]    Consulted and Agree with Plan of Care Patient             Patient will benefit from skilled therapeutic intervention in order to improve the following deficits and impairments:  Abnormal gait, Decreased endurance, Decreased activity tolerance,  Decreased balance, Decreased range of motion, Decreased strength, Difficulty walking, Increased muscle spasms, Improper body mechanics, Postural dysfunction, Pain  Visit Diagnosis: Pain in right hip - Plan: PT plan of care cert/re-cert  Muscle weakness (generalized) - Plan: PT plan of care cert/re-cert  Other abnormalities of gait and mobility - Plan: PT plan of care cert/re-cert     Problem List Patient Active Problem List   Diagnosis Date Noted   Genetic testing 06/27/2018   S/P left knee arthroscopy 02/09/2018   Problems with swallowing and mastication    Globus hystericus    Gastritis    Hiatal hernia     Erin Lucero E Mazal Ebey 10/22/2020, 1:23 PM  [facility] [address] [place], [place], [address] Phone: [phone]   Fax:  [phone]  Name: LEVITA MONICAL MRN: [id number] Date of Birth: 12-16-1960

## 2020-10-25 ENCOUNTER — Ambulatory Visit: Payer: 59 | Attending: Family Medicine | Admitting: Physical Therapy

## 2020-10-25 ENCOUNTER — Encounter: Payer: Self-pay | Admitting: Physical Therapy

## 2020-10-25 ENCOUNTER — Other Ambulatory Visit: Payer: Self-pay

## 2020-10-25 DIAGNOSIS — M6281 Muscle weakness (generalized): Secondary | ICD-10-CM | POA: Diagnosis present

## 2020-10-25 DIAGNOSIS — M25551 Pain in right hip: Secondary | ICD-10-CM | POA: Diagnosis not present

## 2020-10-25 DIAGNOSIS — F411 Generalized anxiety disorder: Secondary | ICD-10-CM | POA: Diagnosis not present

## 2020-10-25 DIAGNOSIS — R2689 Other abnormalities of gait and mobility: Secondary | ICD-10-CM | POA: Insufficient documentation

## 2020-10-25 DIAGNOSIS — F330 Major depressive disorder, recurrent, mild: Secondary | ICD-10-CM | POA: Diagnosis not present

## 2020-10-25 NOTE — Therapy (Signed)
[facility] [address], [address] [place], [place], [address] Phone: [phone]   Fax:  [phone]  Physical Therapy Treatment  Patient Details  Name: Erin Lucero MRN: [id number] Date of Birth: [birth year] Referring Provider (PT): Marinda Elk, MD   Encounter Date: 10/25/2020   PT End of Session - 10/25/20 0934     Visit Number 3    Date for PT Re-Evaluation 12/10/20    Authorization Type [employer]    PT Start Time [phone]    PT Stop Time 1014    PT Time Calculation (min) 41 min    Activity Tolerance Patient tolerated treatment well    Behavior During Therapy [facility] for tasks assessed/performed             Past Medical History:  Diagnosis Date   Arthritis    right great toe   Esophageal spasm    GERD (gastroesophageal reflux disease)    Wears contact lenses     Past Surgical History:  Procedure Laterality Date   AUGMENTATION MAMMAPLASTY Bilateral 2001   retro pec saline   BREAST ENHANCEMENT SURGERY  2005   COLONOSCOPY     ESOPHAGOGASTRODUODENOSCOPY (EGD) WITH PROPOFOL N/A 08/30/2015   Procedure: ESOPHAGOGASTRODUODENOSCOPY (EGD) WITH PROPOFOL;  Surgeon: Midge Minium, MD;  Location: [facility];  Service: Endoscopy;  Laterality: N/A;   SHOULDER ARTHROSCOPY WITH SUBACROMIAL DECOMPRESSION, ROTATOR CUFF REPAIR AND BICEP TENDON REPAIR  02/02/2012   Procedure: SHOULDER ARTHROSCOPY WITH SUBACROMIAL DECOMPRESSION, ROTATOR CUFF REPAIR AND BICEP TENDON REPAIR;  Surgeon: Mable Paris, MD;  Location: [facility];  Service: Orthopedics;  Laterality: Left;  Possible Bicep Tendondesis, open tenodesis    There were no vitals filed for this visit.   Subjective Assessment - 10/25/20 0935     Subjective I've been working hard. Haven't used the walker since Monday.    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 Yes    Pain  Score 3     Pain Location Hip    Pain Orientation Right    Pain Descriptors / Indicators Tightness;Sore    Pain Type Surgical pain                               [facility] Adult PT Treatment/Exercise - 10/25/20 0001       Ambulation/Gait   Ambulation/Gait Yes    Ambulation/Gait Assistance 6: Modified independent (Device/Increase time)    Ambulation Distance (Feet) 80 Feet    Assistive device None    Gait Pattern Decreased stance time - right;Step-through pattern;Decreased trunk rotation    Ambulation Surface Level    Gait Comments slow gait speed      Knee/Hip Exercises: Stretches   Other Knee/Hip Stretches SKTC  x 10 ea      Knee/Hip Exercises: Aerobic   Nustep L 1 x 6 min seat 7 for      Knee/Hip Exercises: Standing   Heel Raises 10 reps    Hip Flexion Both;15 reps;Knee bent    Hip Abduction Both;15 reps;Knee straight    Gait Training weight shifting x 1' each square stance and staggered stance Rt/Lt      Knee/Hip Exercises: Seated   Sit to Sand 10 reps;without UE support      Knee/Hip Exercises: Supine   Short Arc Quad Sets Both;15 reps  Short Arc [employer] Limitations 10 sec hold    Heel Slides Both;15 reps    Bridges with [employer] Both;15 reps   5 sec hold   Bridges with Clamshell Both;15 reps   GTB     Knee/Hip Exercises: Sidelying   Clams right 3-4 reps small range difficult                      PT Short Term Goals - 10/22/20 1318       PT SHORT TERM GOAL #1   Title Patient will be independent with HEP for continued progression at home.    Time 4    Period Weeks    Status New    Target Date 11/12/20      PT SHORT TERM GOAL #2   Title Patient will demonstration 4+/5 global hip strength to more readily perform functional transfers.    Time 4    Period Weeks    Status New    Target Date 11/12/20      PT SHORT TERM GOAL #3   Title Patient will ambulate x500 feet without AD and with minimal gait deviations for  improved household ambulation.    Time 4    Period Weeks    Status New    Target Date 11/12/20      PT SHORT TERM GOAL #4   Title Reduce 5x sit to stand to </= 18 sec    Time 4    Period Weeks    Status New    Target Date 11/19/20               PT Long Term Goals - 10/22/20 1319       PT LONG TERM GOAL #1   Title Patient will be independent with advanced HEP for long term management of symptoms post D/C.    Baseline -    Time 8    Period Weeks    Status New    Target Date 12/17/20      PT LONG TERM GOAL #2   Title Patient will perform complete 5 times sit to stand in 11.5 seconds or less to indicate improved functional mobility and decreased fall risk.    Baseline 25 sec post op (18 sec pre-op)    Time 8    Period Weeks    Status New    Target Date 12/17/20      PT LONG TERM GOAL #3   Title Patient will ascend/descend x12 stairs using recipocal pattern to more readily nagivate at home.    Baseline -    Time 8    Period Weeks    Status New    Target Date 12/17/20      PT LONG TERM GOAL #4   Title Improve FOTO score to >/= 53% to demo improved function of Rt hip    Time 8    Period Weeks    Status New    Target Date 12/17/20      PT LONG TERM GOAL #5   Title -                   Plan - 10/25/20 1508     Clinical Impression Statement Patient presents today without AD. Her gait is slow and without much trunk rotation. Slightly shorter step length with Lt LE. She did well with all TE. Very weak with SDLY clam. Sit to stand with mild deviation left but able to correct  with VCs.    PT Treatment/Interventions ADLs/Self Care Home Management;Cryotherapy;Electrical Stimulation;Gait training;Stair training;Functional mobility training;Therapeutic activities;Therapeutic exercise;Balance training;Patient/family education;Manual techniques;Passive range of motion;Dry needling;Joint Manipulations    PT Next Visit Plan NuStep, pre-gait activities and gait  training, review HEP and progress Rt hip ROM and strength as tol    PT Home Exercise Plan Access Code: [facility]             Patient will benefit from skilled therapeutic intervention in order to improve the following deficits and impairments:  Abnormal gait, Decreased endurance, Decreased activity tolerance, Decreased balance, Decreased range of motion, Decreased strength, Difficulty walking, Increased muscle spasms, Improper body mechanics, Postural dysfunction, Pain  Visit Diagnosis: Pain in right hip  Muscle weakness (generalized)  Other abnormalities of gait and mobility     Problem List Patient Active Problem List   Diagnosis Date Noted   Genetic testing 06/27/2018   S/P left knee arthroscopy 02/09/2018   Problems with swallowing and mastication    Globus hystericus    Gastritis    Hiatal hernia     Raynelle Fanning Manjinder Breau PT 10/25/2020, 3:12 PM  [facility] [address] [place], [place], [address] Phone: [phone]   Fax:  [phone]  Name: KAILIA STARRY MRN: [id number] Date of Birth: 10-29-1960

## 2020-10-30 ENCOUNTER — Encounter: Payer: Self-pay | Admitting: Physical Therapy

## 2020-10-30 ENCOUNTER — Other Ambulatory Visit (HOSPITAL_COMMUNITY): Payer: Self-pay

## 2020-10-30 ENCOUNTER — Ambulatory Visit: Payer: 59 | Admitting: Physical Therapy

## 2020-10-30 ENCOUNTER — Other Ambulatory Visit: Payer: Self-pay

## 2020-10-30 DIAGNOSIS — R2689 Other abnormalities of gait and mobility: Secondary | ICD-10-CM

## 2020-10-30 DIAGNOSIS — M6281 Muscle weakness (generalized): Secondary | ICD-10-CM

## 2020-10-30 DIAGNOSIS — M25551 Pain in right hip: Secondary | ICD-10-CM | POA: Diagnosis not present

## 2020-10-30 DIAGNOSIS — Z9889 Other specified postprocedural states: Secondary | ICD-10-CM | POA: Diagnosis not present

## 2020-10-30 DIAGNOSIS — Z96641 Presence of right artificial hip joint: Secondary | ICD-10-CM | POA: Diagnosis not present

## 2020-10-30 MED ORDER — DULOXETINE HCL 30 MG PO CPEP
ORAL_CAPSULE | ORAL | 0 refills | Status: AC
  Filled 2020-10-30: qty 7, 7d supply, fill #0

## 2020-10-30 MED ORDER — TRINTELLIX 10 MG PO TABS
ORAL_TABLET | ORAL | 0 refills | Status: DC
  Filled 2020-10-30: qty 30, 30d supply, fill #0

## 2020-10-30 NOTE — Therapy (Signed)
[facility] [address], [address] [place], [place], [address] Phone: [phone]   Fax:  [phone]  Physical Therapy Treatment  Patient Details  Name: Erin Lucero MRN: [id number] Date of Birth: Oct 29, 1960 Referring Provider (PT): Marinda Elk, MD   Encounter Date: 10/30/2020   PT End of Session - 10/30/20 1233     Visit Number 4    Date for PT Re-Evaluation 12/10/20    Authorization Type [employer]    PT Start Time 1230    PT Stop Time 1310    PT Time Calculation (min) 40 min    Activity Tolerance Patient tolerated treatment well    Behavior During Therapy [facility] for tasks assessed/performed             Past Medical History:  Diagnosis Date   Arthritis    right great toe   Esophageal spasm    GERD (gastroesophageal reflux disease)    Wears contact lenses     Past Surgical History:  Procedure Laterality Date   AUGMENTATION MAMMAPLASTY Bilateral 2001   retro pec saline   BREAST ENHANCEMENT SURGERY  2005   COLONOSCOPY     ESOPHAGOGASTRODUODENOSCOPY (EGD) WITH PROPOFOL N/A 08/30/2015   Procedure: ESOPHAGOGASTRODUODENOSCOPY (EGD) WITH PROPOFOL;  Surgeon: Midge Minium, MD;  Location: [facility];  Service: Endoscopy;  Laterality: N/A;   SHOULDER ARTHROSCOPY WITH SUBACROMIAL DECOMPRESSION, ROTATOR CUFF REPAIR AND BICEP TENDON REPAIR  02/02/2012   Procedure: SHOULDER ARTHROSCOPY WITH SUBACROMIAL DECOMPRESSION, ROTATOR CUFF REPAIR AND BICEP TENDON REPAIR;  Surgeon: Mable Paris, MD;  Location: [facility];  Service: Orthopedics;  Laterality: Left;  Possible Bicep Tendondesis, open tenodesis    There were no vitals filed for this visit.   Subjective Assessment - 10/30/20 1230     Subjective Doing so well. Just saw surgeon and he was so happy with my progress.  I have minimal pain/soreness.  I am driving now.  I can stand as long as I need to, up to 30 min.    Limitations  Walking;Standing    How long can you sit comfortably? 1 hour but makes hip stiff    How long can you stand comfortably? 30 min    How long can you walk comfortably? 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 Yes    Pain Score 1     Pain Location Hip    Pain Orientation Right    Pain Descriptors / Indicators Tightness;Sore    Pain Type Surgical pain    Pain Onset 1 to 4 weeks ago    Pain Frequency Constant                               [facility] Adult PT Treatment/Exercise - 10/30/20 0001       Transfers   Sit to Stand 7: Independent    Comments uses Rt=Lt WB with sit to stand now      Ambulation/Gait   Ambulation/Gait Yes    Ambulation/Gait Assistance 6: Modified independent (Device/Increase time)    Ambulation Distance (Feet) 80 Feet    Assistive device None    Gait Pattern Decreased stance time - right;Step-through pattern;Decreased trunk rotation;Decreased step length - left      Exercises   Exercises Knee/Hip  Knee/Hip Exercises: Aerobic   Nustep L2 x 6' legs only      Knee/Hip Exercises: Machines for Strengthening   Total Gym Leg Press 70lb bil LEs 1x15, limited hip flexion range due to anterior incision discomfort with full flexion      Knee/Hip Exercises: Standing   Hip Flexion Both;15 reps;Knee bent    Hip Flexion Limitations march tap to 2nd step    Hip Extension Right;5 reps;Knee straight    Extension Limitations from elbows on counter, hip ext to neutral only per post-op precautions    Lateral Step Up Right;1 set;Hand Hold: 1;10 reps;Step Height: 4"    Forward Step Up Right;1 set;10 reps;Step Height: 6";Hand Hold: 1    Forward Step Up Limitations march to 2nd step w/ Lt LE    Step Down Left;2 sets;5 reps;Hand Hold: 1;Step Height: 2";Step Height: 4";10 reps    Step Down Limitations 1x5 4", 1x10 2", heel taps Lt standing on Rt     Rebounder weight shift stagger stance Rt foot fwd x 1'      Knee/Hip Exercises: Seated   Sit to Sand 10 reps;without UE support   holding 5lb     Knee/Hip Exercises: Supine   Short Arc [employer] Right;10 reps    Short Arc Quad Sets Limitations then [employer] with each rep    Bridges with [employer] Strengthening;Both;10 reps   2 reps with knee ext Rt/Lt, not ready yet   Bridges with Clamshell Strengthening;15 reps;Both   red loop band   Straight Leg Raises Strengthening;Right;10 reps    Straight Leg Raises Limitations from SAQ bolster    Other Supine Knee/Hip Exercises Rt hip flexion isom 5x5" holds    Other Supine Knee/Hip Exercises single leg clam red loop band Rt/Lt each outer 1/3 range x 5 each                      PT Short Term Goals - 10/30/20 1324       PT SHORT TERM GOAL #1   Title Patient will be independent with HEP for continued progression at home.    Status On-going      PT SHORT TERM GOAL #2   Title Patient will demonstration 4+/5 global hip strength to more readily perform functional transfers.    Status On-going      PT SHORT TERM GOAL #3   Title Patient will ambulate x500 feet without AD and with minimal gait deviations for improved household ambulation.    Status On-going      PT SHORT TERM GOAL #4   Title Reduce 5x sit to stand to </= 18 sec    Status On-going               PT Long Term Goals - 10/22/20 1319       PT LONG TERM GOAL #1   Title Patient will be independent with advanced HEP for long term management of symptoms post D/C.    Baseline -    Time 8    Period Weeks    Status New    Target Date 12/17/20      PT LONG TERM GOAL #2   Title Patient will perform complete 5 times sit to stand in 11.5 seconds or less to indicate improved functional mobility and decreased fall risk.    Baseline 25 sec post op (18 sec pre-op)    Time 8    Period Weeks    Status New  Target Date 12/17/20      PT LONG TERM GOAL #3   Title  Patient will ascend/descend x12 stairs using recipocal pattern to more readily nagivate at home.    Baseline -    Time 8    Period Weeks    Status New    Target Date 12/17/20      PT LONG TERM GOAL #4   Title Improve FOTO score to >/= 53% to demo improved function of Rt hip    Time 8    Period Weeks    Status New    Target Date 12/17/20      PT LONG TERM GOAL #5   Title -                   Plan - 10/30/20 1317     Clinical Impression Statement Pt continues to ambulate without AD.  She continues to have limited step length with Lt LE due to reduced stance time on Rt LE.  She is now able to perform equal WB through bil LEs with sit to stand.  Pt is now ascending stairs with alternating pattern but descends with step to.  Pt had great challenge with step downs from 4" riser today so switched to 2".  Good tolerance of all ther ex today.  She was weak with SAQ to SLR from bolster but able to lift Rt LE approx 2" off bolster x 10 reps.  Continue along POC.   Pt would like to attend 3 more visits and possibly d/c at that time to HEP.    Rehab Potential Excellent    PT Frequency 2x / week    PT Duration 8 weeks    PT Treatment/Interventions ADLs/Self Care Home Management;Cryotherapy;Electrical Stimulation;Gait training;Stair training;Functional mobility training;Therapeutic activities;Therapeutic exercise;Balance training;Patient/family education;Manual techniques;Passive range of motion;Dry needling;Joint Manipulations    PT Next Visit Plan NuStep, pre-gait activities and gait training, review HEP and progress Rt hip ROM and strength as tol    PT Home Exercise Plan Access Code: [email or web address]    Consulted and Agree with Plan of Care Patient             Patient will benefit from skilled therapeutic intervention in order to improve the following deficits and impairments:     Visit Diagnosis: Pain in right hip  Muscle weakness (generalized)  Other abnormalities of gait and  mobility     Problem List Patient Active Problem List   Diagnosis Date Noted   Genetic testing 06/27/2018   S/P left knee arthroscopy 02/09/2018   Problems with swallowing and mastication    Globus hystericus    Gastritis    Hiatal hernia     Rochell Puett, PT 10/30/20 1:26 PM   [facility] [address], [address] [place], [place], [address] Phone: [phone]   Fax:  [phone]  Name: Erin Lucero MRN: [id number] Date of Birth: [birth year]

## 2020-11-01 ENCOUNTER — Encounter: Payer: 59 | Admitting: Physical Therapy

## 2020-11-05 ENCOUNTER — Encounter: Payer: Self-pay | Admitting: Physical Therapy

## 2020-11-05 ENCOUNTER — Ambulatory Visit: Payer: 59 | Admitting: Physical Therapy

## 2020-11-05 ENCOUNTER — Other Ambulatory Visit: Payer: Self-pay

## 2020-11-05 DIAGNOSIS — M6281 Muscle weakness (generalized): Secondary | ICD-10-CM | POA: Diagnosis not present

## 2020-11-05 DIAGNOSIS — M25551 Pain in right hip: Secondary | ICD-10-CM | POA: Diagnosis not present

## 2020-11-05 DIAGNOSIS — R2689 Other abnormalities of gait and mobility: Secondary | ICD-10-CM | POA: Diagnosis not present

## 2020-11-05 NOTE — Therapy (Addendum)
[facility] [address], [place], [place], [address] Phone: [phone]   Fax:  [phone]  Physical Therapy Treatment/Discharge  Patient Details  Name: Erin Lucero MRN: [id number] Date of Birth: September 13, 1960 Referring Provider (PT): Briscoe Deutscher, MD   Encounter Date: 11/05/2020   PT End of Session - 11/05/20 1317     Visit Number 5    Date for PT Re-Evaluation 12/10/20    Authorization Type [employer]    PT Start Time 8453    PT Stop Time 1315    PT Time Calculation (min) 44 min    Activity Tolerance Patient tolerated treatment well;No increased pain    Behavior During Therapy WFL for tasks assessed/performed             Past Medical History:  Diagnosis Date   Arthritis    right great toe   Esophageal spasm    GERD (gastroesophageal reflux disease)    Wears contact lenses     Past Surgical History:  Procedure Laterality Date   AUGMENTATION MAMMAPLASTY Bilateral 2001   retro pec saline   BREAST ENHANCEMENT SURGERY  2005   COLONOSCOPY     ESOPHAGOGASTRODUODENOSCOPY (EGD) WITH PROPOFOL N/A 08/30/2015   Procedure: ESOPHAGOGASTRODUODENOSCOPY (EGD) WITH PROPOFOL;  Surgeon: Lucilla Lame, MD;  Location: [facility];  Service: Endoscopy;  Laterality: N/A;   SHOULDER ARTHROSCOPY WITH SUBACROMIAL DECOMPRESSION, ROTATOR CUFF REPAIR AND BICEP TENDON REPAIR  02/02/2012   Procedure: SHOULDER ARTHROSCOPY WITH SUBACROMIAL DECOMPRESSION, ROTATOR CUFF REPAIR AND BICEP TENDON REPAIR;  Surgeon: Nita Sells, MD;  Location: [facility];  Service: Orthopedics;  Laterality: Left;  Possible Bicep Tendondesis, open tenodesis    There were no vitals filed for this visit.   Subjective Assessment - 11/05/20 1250     Subjective Pt states that things continue to go well. No issues at the moment.    Limitations Walking;Standing    How long can you sit comfortably? 1 hour but makes hip  stiff    How long can you stand comfortably? 30 min    How long can you walk comfortably? 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 No/denies    Pain Onset 1 to 4 weeks ago                               [facility] Adult PT Treatment/Exercise - 11/05/20 0001       Ambulation/Gait   Gait Comments step over back with Lt LE over hurdle x15 reps      Self-Care   Self-Care Scar Mobilizations    Scar Mobilizations scar mobilization side to side only/gentle to break up adhesions      Knee/Hip Exercises: Machines for Strengthening   Total Gym Leg Press #55 B LEs as warmup x15 reps; Rt LE only #40 2x10 reps      Knee/Hip Exercises: Standing   Forward Step Up Right;2 sets;10 reps;Hand Hold: 1    Forward Step Up Limitations holding ski, Lt hip flexion    Other Standing Knee Exercises Rt LE lunge with Lt LE on slider back and side x10 reps. Rt hip slider half circle x10 reps   holding ski poles     Knee/Hip Exercises: Seated   Sit to Sand 2 sets;10 reps;without UE  support   holding #10 dumbbell     Knee/Hip Exercises: Sidelying   Other Sidelying Knee/Hip Exercises Rt plank elbows/knees, red TB around knees with mini clam 2x5 reps                     PT Education - 11/05/20 1316     Education Details one direction scar massage to decrease adhesions; updated HEP    Person(s) Educated Patient    Methods Explanation;Verbal cues;Handout;Demonstration    Comprehension Verbalized understanding;Returned demonstration              PT Short Term Goals - 10/30/20 1324       PT SHORT TERM GOAL #1   Title Patient will be independent with HEP for continued progression at home.    Status On-going      PT SHORT TERM GOAL #2   Title Patient will demonstration 4+/5 global hip strength to more readily perform functional transfers.    Status On-going       PT SHORT TERM GOAL #3   Title Patient will ambulate x500 feet without AD and with minimal gait deviations for improved household ambulation.    Status On-going      PT SHORT TERM GOAL #4   Title Reduce 5x sit to stand to </= 18 sec    Status On-going               PT Long Term Goals - 10/22/20 1319       PT LONG TERM GOAL #1   Title Patient will be independent with advanced HEP for long term management of symptoms post D/C.    Baseline -    Time 8    Period Weeks    Status New    Target Date 12/17/20      PT LONG TERM GOAL #2   Title Patient will perform complete 5 times sit to stand in 11.5 seconds or less to indicate improved functional mobility and decreased fall risk.    Baseline 25 sec post op (18 sec pre-op)    Time 8    Period Weeks    Status New    Target Date 12/17/20      PT LONG TERM GOAL #3   Title Patient will ascend/descend x12 stairs using recipocal pattern to more readily nagivate at home.    Baseline -    Time 8    Period Weeks    Status New    Target Date 12/17/20      PT LONG TERM GOAL #4   Title Improve FOTO score to >/= 53% to demo improved function of Rt hip    Time 8    Period Weeks    Status New    Target Date 12/17/20      PT LONG TERM GOAL #5   Title -                   Plan - 11/05/20 1317     Clinical Impression Statement Pt is doing well with her HEP and notes occasional stiffness in the front of her hip. Pt was able to complete single leg press in addition to progressing several other functional exercises. Pt had some difficulty maintaining midline with squatting while holding 10# dumbbell but was able to adjust the LE placement for better alignment. PT assessed pt's surgical incision and noted some adhesions forming. PT educated pt on self scare mobilization and pt verbalized understanding of this. Will  likely plan for d/c at pt's next visit unless any new issues/concerns arise.    Rehab Potential Excellent    PT  Frequency 2x / week    PT Duration 8 weeks    PT Treatment/Interventions ADLs/Self Care Home Management;Cryotherapy;Electrical Stimulation;Gait training;Stair training;Functional mobility training;Therapeutic activities;Therapeutic exercise;Balance training;Patient/family education;Manual techniques;Passive range of motion;Dry needling;Joint Manipulations    PT Next Visit Plan NuStep, pre-gait activities and gait training, review HEP and progress Rt hip ROM and strength as tol    PT Home Exercise Plan Access Code: [email or web address]    Consulted and Agree with Plan of Care Patient             Patient will benefit from skilled therapeutic intervention in order to improve the following deficits and impairments:     Visit Diagnosis: Pain in right hip  Muscle weakness (generalized)  Other abnormalities of gait and mobility     Problem List Patient Active Problem List   Diagnosis Date Noted   Genetic testing 06/27/2018   S/P left knee arthroscopy 02/09/2018   Problems with swallowing and mastication    Globus hystericus    Gastritis    Hiatal hernia    8:18 PM,11/05/20 Sherol Dade PT, DPT [facility] [address], [place], [place], [address] Phone: [phone]   Fax:  [phone]  Name: Erin Lucero MRN: [id number] Date of Birth: 1960/12/04  *addendum to resolve episode of care and d/c pt from [facility]  Visits from Start of Care: 5  Current functional level related to goals / functional outcomes: See above for more details    Remaining deficits: See above for more details    Education / Equipment: See above for more details   Patient agrees to discharge. Patient goals were OP goals: met Patient is being discharged due to the patient's request.  3:34 PM,11/08/20 Sara Costella PT, [facility]

## 2020-11-05 NOTE — Patient Instructions (Signed)
Access Code: [email or web address] URL: [email or web address] Date: 11/05/2020 Prepared by: [facility]  Exercises Bridge with Hip Abduction and Resistance - 1 x daily - 7 x weekly - 2 sets - 5 reps Sit to Stand with Armchair - 2 x daily - 7 x weekly - 2 sets - 10 reps Side Plank with Clam and Resistance - 1 x daily - 7 x weekly - 3 sets - 10 reps Crossover Step Up with Knee Drive - 1 x daily - 7 x weekly - 2 sets - 10 reps   [facility] [address], [address] [place], [place] [address] Phone # [phone] Fax [phone]

## 2020-11-08 ENCOUNTER — Encounter: Payer: 59 | Admitting: Physical Therapy

## 2020-11-08 ENCOUNTER — Other Ambulatory Visit: Payer: Self-pay

## 2020-11-09 ENCOUNTER — Other Ambulatory Visit: Payer: Self-pay

## 2020-11-12 ENCOUNTER — Other Ambulatory Visit: Payer: Self-pay

## 2020-11-12 ENCOUNTER — Other Ambulatory Visit (HOSPITAL_COMMUNITY): Payer: Self-pay

## 2020-11-12 DIAGNOSIS — F330 Major depressive disorder, recurrent, mild: Secondary | ICD-10-CM | POA: Diagnosis not present

## 2020-11-12 DIAGNOSIS — F411 Generalized anxiety disorder: Secondary | ICD-10-CM | POA: Diagnosis not present

## 2020-11-13 ENCOUNTER — Other Ambulatory Visit (HOSPITAL_COMMUNITY): Payer: Self-pay

## 2020-11-16 ENCOUNTER — Other Ambulatory Visit (HOSPITAL_COMMUNITY): Payer: Self-pay

## 2020-11-20 ENCOUNTER — Other Ambulatory Visit (HOSPITAL_COMMUNITY): Payer: Self-pay

## 2020-11-20 DIAGNOSIS — F411 Generalized anxiety disorder: Secondary | ICD-10-CM | POA: Diagnosis not present

## 2020-11-20 DIAGNOSIS — Z760 Encounter for issue of repeat prescription: Secondary | ICD-10-CM | POA: Diagnosis not present

## 2020-11-20 DIAGNOSIS — G4700 Insomnia, unspecified: Secondary | ICD-10-CM | POA: Diagnosis not present

## 2020-11-20 MED ORDER — ALPRAZOLAM 0.5 MG PO TABS
ORAL_TABLET | ORAL | 0 refills | Status: AC
  Filled 2020-11-20: qty 180, 90d supply, fill #0

## 2020-11-20 MED ORDER — TRINTELLIX 10 MG PO TABS
ORAL_TABLET | Freq: Every day | ORAL | 2 refills | Status: AC
  Filled 2020-11-20 – 2020-11-23 (×2): qty 30, 30d supply, fill #0
  Filled 2021-01-07: qty 30, 30d supply, fill #1
  Filled 2021-02-04: qty 10, 10d supply, fill #2
  Filled 2021-02-13: qty 10, 10d supply, fill #3
  Filled 2021-02-15: qty 20, 20d supply, fill #3

## 2020-11-20 MED ORDER — ZOLPIDEM TARTRATE 10 MG PO TABS
ORAL_TABLET | ORAL | 1 refills | Status: AC
  Filled 2020-11-20 – 2020-11-23 (×2): qty 90, 90d supply, fill #0
  Filled 2021-02-18: qty 90, 90d supply, fill #1

## 2020-11-23 ENCOUNTER — Other Ambulatory Visit (HOSPITAL_COMMUNITY): Payer: Self-pay

## 2020-11-30 ENCOUNTER — Other Ambulatory Visit: Payer: Self-pay

## 2020-12-04 ENCOUNTER — Other Ambulatory Visit (HOSPITAL_COMMUNITY): Payer: Self-pay

## 2020-12-12 ENCOUNTER — Other Ambulatory Visit (HOSPITAL_COMMUNITY): Payer: Self-pay

## 2020-12-13 ENCOUNTER — Other Ambulatory Visit (HOSPITAL_COMMUNITY): Payer: Self-pay

## 2020-12-19 ENCOUNTER — Other Ambulatory Visit: Payer: Self-pay

## 2020-12-31 DIAGNOSIS — F330 Major depressive disorder, recurrent, mild: Secondary | ICD-10-CM | POA: Diagnosis not present

## 2020-12-31 DIAGNOSIS — F411 Generalized anxiety disorder: Secondary | ICD-10-CM | POA: Diagnosis not present

## 2021-01-07 ENCOUNTER — Other Ambulatory Visit (HOSPITAL_COMMUNITY): Payer: Self-pay

## 2021-01-11 DIAGNOSIS — H524 Presbyopia: Secondary | ICD-10-CM | POA: Diagnosis not present

## 2021-01-24 ENCOUNTER — Other Ambulatory Visit (HOSPITAL_COMMUNITY): Payer: Self-pay

## 2021-01-24 DIAGNOSIS — G4700 Insomnia, unspecified: Secondary | ICD-10-CM | POA: Diagnosis not present

## 2021-01-24 DIAGNOSIS — F411 Generalized anxiety disorder: Secondary | ICD-10-CM | POA: Diagnosis not present

## 2021-01-24 MED ORDER — ALPRAZOLAM 0.5 MG PO TABS
0.5000 mg | ORAL_TABLET | Freq: Two times a day (BID) | ORAL | 0 refills | Status: AC
  Filled 2021-01-24 – 2021-02-15 (×2): qty 180, 90d supply, fill #0

## 2021-01-24 MED ORDER — ZOLPIDEM TARTRATE 10 MG PO TABS
10.0000 mg | ORAL_TABLET | Freq: Every evening | ORAL | 0 refills | Status: AC | PRN
  Filled 2021-01-24 – 2021-02-15 (×2): qty 90, 90d supply, fill #0

## 2021-01-24 MED ORDER — TRINTELLIX 10 MG PO TABS
10.0000 mg | ORAL_TABLET | Freq: Every day | ORAL | 3 refills | Status: AC
  Filled 2021-01-24 – 2021-03-06 (×2): qty 30, 30d supply, fill #0

## 2021-02-05 ENCOUNTER — Other Ambulatory Visit (HOSPITAL_COMMUNITY): Payer: Self-pay

## 2021-02-13 ENCOUNTER — Other Ambulatory Visit (HOSPITAL_COMMUNITY): Payer: Self-pay

## 2021-02-15 ENCOUNTER — Other Ambulatory Visit (HOSPITAL_COMMUNITY): Payer: Self-pay

## 2021-02-19 ENCOUNTER — Other Ambulatory Visit (HOSPITAL_COMMUNITY): Payer: Self-pay

## 2021-03-05 ENCOUNTER — Other Ambulatory Visit (HOSPITAL_COMMUNITY): Payer: Self-pay

## 2021-03-06 ENCOUNTER — Other Ambulatory Visit (HOSPITAL_COMMUNITY): Payer: Self-pay

## 2021-03-06 MED ORDER — TRINTELLIX 10 MG PO TABS
10.0000 mg | ORAL_TABLET | Freq: Every day | ORAL | 3 refills | Status: AC
  Filled 2021-03-06: qty 30, 30d supply, fill #0
  Filled 2021-04-05: qty 7, 7d supply, fill #1
  Filled 2021-04-15: qty 5, 5d supply, fill #2
  Filled 2021-04-19: qty 10, 10d supply, fill #3

## 2021-04-05 ENCOUNTER — Other Ambulatory Visit (HOSPITAL_COMMUNITY): Payer: Self-pay

## 2021-04-05 MED ORDER — ALPRAZOLAM 0.5 MG PO TABS
ORAL_TABLET | ORAL | 0 refills | Status: DC
  Filled 2021-06-28: qty 180, 90d supply, fill #0

## 2021-04-05 MED ORDER — ZOLPIDEM TARTRATE 10 MG PO TABS
ORAL_TABLET | ORAL | 0 refills | Status: DC
  Filled 2021-05-17: qty 90, 90d supply, fill #0

## 2021-04-15 ENCOUNTER — Other Ambulatory Visit (HOSPITAL_COMMUNITY): Payer: Self-pay

## 2021-04-19 ENCOUNTER — Other Ambulatory Visit (HOSPITAL_COMMUNITY): Payer: Self-pay

## 2021-05-17 ENCOUNTER — Other Ambulatory Visit (HOSPITAL_COMMUNITY): Payer: Self-pay

## 2021-06-07 ENCOUNTER — Other Ambulatory Visit (HOSPITAL_COMMUNITY): Payer: Self-pay

## 2021-06-13 ENCOUNTER — Other Ambulatory Visit (HOSPITAL_COMMUNITY): Payer: Self-pay

## 2021-06-27 ENCOUNTER — Other Ambulatory Visit (HOSPITAL_COMMUNITY): Payer: Self-pay

## 2021-06-28 ENCOUNTER — Other Ambulatory Visit (HOSPITAL_COMMUNITY): Payer: Self-pay

## 2021-07-24 ENCOUNTER — Other Ambulatory Visit (HOSPITAL_COMMUNITY): Payer: Self-pay

## 2021-07-24 MED ORDER — VENLAFAXINE HCL ER 75 MG PO CP24
ORAL_CAPSULE | ORAL | 0 refills | Status: DC
  Filled 2021-07-24: qty 30, 30d supply, fill #0

## 2021-07-24 MED ORDER — ZOLPIDEM TARTRATE 10 MG PO TABS
ORAL_TABLET | ORAL | 0 refills | Status: DC
  Filled 2021-07-24 – 2021-08-11 (×2): qty 90, 90d supply, fill #0

## 2021-07-24 MED ORDER — ALPRAZOLAM 0.5 MG PO TABS
0.5000 mg | ORAL_TABLET | Freq: Two times a day (BID) | ORAL | 0 refills | Status: AC | PRN
  Filled 2021-07-24: qty 180, 90d supply, fill #0
  Filled 2021-11-03: qty 60, 30d supply, fill #0
  Filled 2021-12-12: qty 60, 30d supply, fill #1
  Filled 2022-01-13: qty 60, 30d supply, fill #2

## 2021-08-12 ENCOUNTER — Other Ambulatory Visit (HOSPITAL_COMMUNITY): Payer: Self-pay

## 2021-08-13 ENCOUNTER — Other Ambulatory Visit (HOSPITAL_COMMUNITY): Payer: Self-pay

## 2021-08-17 ENCOUNTER — Other Ambulatory Visit (HOSPITAL_COMMUNITY): Payer: Self-pay

## 2021-08-19 ENCOUNTER — Other Ambulatory Visit (HOSPITAL_COMMUNITY): Payer: Self-pay

## 2021-08-19 MED ORDER — VENLAFAXINE HCL ER 75 MG PO CP24
ORAL_CAPSULE | ORAL | 0 refills | Status: AC
  Filled 2021-08-19: qty 30, 30d supply, fill #0
  Filled 2022-02-19: qty 30, 30d supply, fill #1

## 2021-09-11 ENCOUNTER — Other Ambulatory Visit (HOSPITAL_COMMUNITY): Payer: Self-pay

## 2021-09-11 MED ORDER — VENLAFAXINE HCL ER 75 MG PO CP24
ORAL_CAPSULE | ORAL | 1 refills | Status: AC
  Filled 2021-09-20: qty 30, 30d supply, fill #0
  Filled 2021-10-20: qty 30, 30d supply, fill #1
  Filled 2021-11-20: qty 30, 30d supply, fill #2
  Filled 2021-12-20: qty 30, 30d supply, fill #3
  Filled 2022-01-18: qty 30, 30d supply, fill #4

## 2021-09-11 MED ORDER — ALPRAZOLAM 0.5 MG PO TABS
0.5000 mg | ORAL_TABLET | Freq: Two times a day (BID) | ORAL | 1 refills | Status: DC | PRN
  Filled 2022-02-06 – 2022-02-07 (×2): qty 180, fill #0
  Filled 2022-02-10: qty 60, 30d supply, fill #0

## 2021-09-11 MED ORDER — ZOLPIDEM TARTRATE 10 MG PO TABS
ORAL_TABLET | ORAL | 1 refills | Status: AC
  Filled 2021-11-12: qty 90, 90d supply, fill #0
  Filled 2022-02-05 – 2022-02-07 (×2): qty 90, 90d supply, fill #1

## 2021-09-20 ENCOUNTER — Other Ambulatory Visit (HOSPITAL_COMMUNITY): Payer: Self-pay

## 2021-10-21 ENCOUNTER — Other Ambulatory Visit (HOSPITAL_COMMUNITY): Payer: Self-pay

## 2021-11-04 ENCOUNTER — Other Ambulatory Visit (HOSPITAL_COMMUNITY): Payer: Self-pay

## 2021-11-12 ENCOUNTER — Other Ambulatory Visit (HOSPITAL_COMMUNITY): Payer: Self-pay

## 2021-11-20 ENCOUNTER — Other Ambulatory Visit (HOSPITAL_COMMUNITY): Payer: Self-pay

## 2021-11-21 ENCOUNTER — Other Ambulatory Visit (HOSPITAL_COMMUNITY): Payer: Self-pay

## 2021-12-12 ENCOUNTER — Other Ambulatory Visit (HOSPITAL_COMMUNITY): Payer: Self-pay

## 2021-12-13 ENCOUNTER — Other Ambulatory Visit (HOSPITAL_COMMUNITY): Payer: Self-pay

## 2021-12-23 ENCOUNTER — Other Ambulatory Visit (HOSPITAL_COMMUNITY): Payer: Self-pay

## 2022-01-13 ENCOUNTER — Other Ambulatory Visit (HOSPITAL_COMMUNITY): Payer: Self-pay

## 2022-01-14 ENCOUNTER — Other Ambulatory Visit (HOSPITAL_COMMUNITY): Payer: Self-pay

## 2022-01-20 ENCOUNTER — Other Ambulatory Visit (HOSPITAL_COMMUNITY): Payer: Self-pay

## 2022-01-22 ENCOUNTER — Other Ambulatory Visit (HOSPITAL_COMMUNITY): Payer: Self-pay

## 2022-01-23 ENCOUNTER — Other Ambulatory Visit (HOSPITAL_COMMUNITY): Payer: Self-pay

## 2022-01-28 ENCOUNTER — Other Ambulatory Visit (HOSPITAL_COMMUNITY): Payer: Self-pay

## 2022-02-06 ENCOUNTER — Other Ambulatory Visit (HOSPITAL_COMMUNITY): Payer: Self-pay

## 2022-02-07 ENCOUNTER — Other Ambulatory Visit: Payer: Self-pay

## 2022-02-07 ENCOUNTER — Other Ambulatory Visit (HOSPITAL_COMMUNITY): Payer: Self-pay

## 2022-02-10 ENCOUNTER — Other Ambulatory Visit (HOSPITAL_COMMUNITY): Payer: Self-pay

## 2022-02-10 ENCOUNTER — Other Ambulatory Visit: Payer: Self-pay

## 2022-02-10 MED ORDER — VALACYCLOVIR HCL 500 MG PO TABS
ORAL_TABLET | ORAL | 1 refills | Status: AC
  Filled 2022-02-10: qty 10, 5d supply, fill #0
  Filled 2022-06-12: qty 10, 5d supply, fill #1

## 2022-02-11 ENCOUNTER — Other Ambulatory Visit (HOSPITAL_COMMUNITY): Payer: Self-pay

## 2022-02-12 ENCOUNTER — Other Ambulatory Visit (HOSPITAL_COMMUNITY): Payer: Self-pay

## 2022-02-12 MED ORDER — VALACYCLOVIR HCL 500 MG PO TABS
500.0000 mg | ORAL_TABLET | Freq: Every day | ORAL | 3 refills | Status: AC
  Filled 2022-02-12 – 2022-02-16 (×2): qty 30, 30d supply, fill #0
  Filled 2022-04-27: qty 30, 30d supply, fill #1
  Filled 2022-07-08: qty 30, 30d supply, fill #2
  Filled 2022-09-25: qty 30, 30d supply, fill #3
  Filled 2022-11-03: qty 30, 30d supply, fill #4
  Filled 2023-01-05 (×2): qty 30, 30d supply, fill #5
  Filled 2023-02-01: qty 30, 30d supply, fill #6

## 2022-02-12 MED ORDER — DOXYCYCLINE HYCLATE 50 MG PO CAPS
50.0000 mg | ORAL_CAPSULE | Freq: Every day | ORAL | 1 refills | Status: DC
  Filled 2022-02-12: qty 30, 30d supply, fill #0
  Filled 2022-03-16: qty 30, 30d supply, fill #1

## 2022-02-12 MED ORDER — METRONIDAZOLE 1 % EX GEL
CUTANEOUS | 3 refills | Status: AC
  Filled 2022-02-12: qty 60, 30d supply, fill #0

## 2022-02-13 ENCOUNTER — Other Ambulatory Visit (HOSPITAL_COMMUNITY): Payer: Self-pay

## 2022-02-18 ENCOUNTER — Other Ambulatory Visit (HOSPITAL_COMMUNITY): Payer: Self-pay

## 2022-02-19 ENCOUNTER — Other Ambulatory Visit (HOSPITAL_COMMUNITY): Payer: Self-pay

## 2022-02-20 ENCOUNTER — Other Ambulatory Visit (HOSPITAL_BASED_OUTPATIENT_CLINIC_OR_DEPARTMENT_OTHER): Payer: Self-pay

## 2022-03-11 ENCOUNTER — Other Ambulatory Visit (HOSPITAL_COMMUNITY): Payer: Self-pay

## 2022-03-12 ENCOUNTER — Other Ambulatory Visit (HOSPITAL_COMMUNITY): Payer: Self-pay

## 2022-03-12 MED ORDER — ZOLPIDEM TARTRATE 10 MG PO TABS
10.0000 mg | ORAL_TABLET | Freq: Every evening | ORAL | 0 refills | Status: AC | PRN
  Filled 2022-03-12 – 2022-09-04 (×2): qty 14, 14d supply, fill #0

## 2022-03-12 MED ORDER — VENLAFAXINE HCL ER 75 MG PO CP24
75.0000 mg | ORAL_CAPSULE | Freq: Every day | ORAL | 0 refills | Status: AC
  Filled 2022-03-12: qty 14, 14d supply, fill #0

## 2022-03-12 MED ORDER — ALPRAZOLAM 0.5 MG PO TABS
0.5000 mg | ORAL_TABLET | Freq: Two times a day (BID) | ORAL | 0 refills | Status: DC | PRN
  Filled 2022-03-12: qty 28, 14d supply, fill #0

## 2022-03-19 ENCOUNTER — Other Ambulatory Visit (HOSPITAL_COMMUNITY): Payer: Self-pay

## 2022-03-19 MED ORDER — VENLAFAXINE HCL ER 75 MG PO CP24
75.0000 mg | ORAL_CAPSULE | Freq: Every day | ORAL | 0 refills | Status: AC
  Filled 2022-04-07: qty 90, 90d supply, fill #0

## 2022-03-19 MED ORDER — ZOLPIDEM TARTRATE 10 MG PO TABS
10.0000 mg | ORAL_TABLET | Freq: Every evening | ORAL | 0 refills | Status: AC | PRN
  Filled 2022-05-13: qty 30, 30d supply, fill #0
  Filled 2022-06-12: qty 30, 30d supply, fill #1
  Filled 2022-07-30: qty 30, 30d supply, fill #2

## 2022-03-19 MED ORDER — ALPRAZOLAM 0.5 MG PO TABS
0.5000 mg | ORAL_TABLET | Freq: Two times a day (BID) | ORAL | 0 refills | Status: DC | PRN
  Filled 2022-03-19: qty 180, 90d supply, fill #0
  Filled 2022-03-22: qty 60, 30d supply, fill #0
  Filled 2022-04-23: qty 60, 30d supply, fill #1
  Filled 2022-05-23: qty 60, 30d supply, fill #2

## 2022-03-22 ENCOUNTER — Other Ambulatory Visit (HOSPITAL_COMMUNITY): Payer: Self-pay

## 2022-04-07 ENCOUNTER — Other Ambulatory Visit (HOSPITAL_COMMUNITY): Payer: Self-pay

## 2022-04-23 ENCOUNTER — Other Ambulatory Visit (HOSPITAL_COMMUNITY): Payer: Self-pay

## 2022-05-13 ENCOUNTER — Other Ambulatory Visit (HOSPITAL_COMMUNITY): Payer: Self-pay

## 2022-05-23 ENCOUNTER — Other Ambulatory Visit (HOSPITAL_COMMUNITY): Payer: Self-pay

## 2022-05-23 ENCOUNTER — Other Ambulatory Visit: Payer: Self-pay

## 2022-06-02 ENCOUNTER — Other Ambulatory Visit (HOSPITAL_COMMUNITY): Payer: Self-pay

## 2022-06-12 ENCOUNTER — Other Ambulatory Visit (HOSPITAL_COMMUNITY): Payer: Self-pay

## 2022-06-13 ENCOUNTER — Other Ambulatory Visit: Payer: Self-pay

## 2022-06-21 ENCOUNTER — Other Ambulatory Visit (HOSPITAL_COMMUNITY): Payer: Self-pay

## 2022-06-23 ENCOUNTER — Other Ambulatory Visit (HOSPITAL_COMMUNITY): Payer: Self-pay

## 2022-06-23 MED ORDER — VENLAFAXINE HCL ER 75 MG PO CP24
75.0000 mg | ORAL_CAPSULE | Freq: Every day | ORAL | 0 refills | Status: AC
  Filled 2022-06-23: qty 30, 30d supply, fill #0
  Filled 2022-08-11: qty 30, 30d supply, fill #1
  Filled 2022-09-06: qty 30, 30d supply, fill #2

## 2022-06-23 MED ORDER — ALPRAZOLAM 0.5 MG PO TABS
0.5000 mg | ORAL_TABLET | Freq: Two times a day (BID) | ORAL | 0 refills | Status: DC | PRN
  Filled 2022-06-23: qty 60, 30d supply, fill #0
  Filled 2022-07-23: qty 60, 30d supply, fill #1
  Filled 2022-08-22: qty 60, 30d supply, fill #2

## 2022-06-23 MED ORDER — ZOLPIDEM TARTRATE 10 MG PO TABS
10.0000 mg | ORAL_TABLET | Freq: Every evening | ORAL | 0 refills | Status: AC
  Filled 2022-09-04: qty 30, 30d supply, fill #0
  Filled 2022-09-30 – 2022-10-01 (×2): qty 30, 30d supply, fill #1

## 2022-06-25 ENCOUNTER — Other Ambulatory Visit (HOSPITAL_COMMUNITY): Payer: Self-pay

## 2022-06-25 MED ORDER — DOXYCYCLINE HYCLATE 50 MG PO CAPS
50.0000 mg | ORAL_CAPSULE | Freq: Every day | ORAL | 3 refills | Status: AC
  Filled 2022-06-25 – 2022-07-08 (×2): qty 30, 30d supply, fill #0
  Filled 2022-09-12: qty 30, 30d supply, fill #1
  Filled 2022-11-03: qty 30, 30d supply, fill #2
  Filled 2023-02-01: qty 30, 30d supply, fill #3

## 2022-07-01 ENCOUNTER — Other Ambulatory Visit (HOSPITAL_COMMUNITY): Payer: Self-pay

## 2022-07-04 ENCOUNTER — Other Ambulatory Visit (HOSPITAL_COMMUNITY): Payer: Self-pay

## 2022-07-08 ENCOUNTER — Other Ambulatory Visit (HOSPITAL_COMMUNITY): Payer: Self-pay

## 2022-07-23 ENCOUNTER — Other Ambulatory Visit (HOSPITAL_COMMUNITY): Payer: Self-pay

## 2022-07-25 ENCOUNTER — Other Ambulatory Visit (HOSPITAL_COMMUNITY): Payer: Self-pay

## 2022-07-30 ENCOUNTER — Other Ambulatory Visit (HOSPITAL_COMMUNITY): Payer: Self-pay

## 2022-07-30 ENCOUNTER — Other Ambulatory Visit: Payer: Self-pay

## 2022-07-31 ENCOUNTER — Other Ambulatory Visit (HOSPITAL_COMMUNITY): Payer: Self-pay

## 2022-08-11 ENCOUNTER — Other Ambulatory Visit (HOSPITAL_COMMUNITY): Payer: Self-pay

## 2022-08-22 ENCOUNTER — Other Ambulatory Visit (HOSPITAL_COMMUNITY): Payer: Self-pay

## 2022-09-04 ENCOUNTER — Other Ambulatory Visit (HOSPITAL_COMMUNITY): Payer: Self-pay

## 2022-09-06 ENCOUNTER — Other Ambulatory Visit (HOSPITAL_COMMUNITY): Payer: Self-pay

## 2022-09-24 ENCOUNTER — Other Ambulatory Visit (HOSPITAL_COMMUNITY): Payer: Self-pay

## 2022-09-25 ENCOUNTER — Other Ambulatory Visit (HOSPITAL_COMMUNITY): Payer: Self-pay

## 2022-09-25 MED ORDER — ALPRAZOLAM 0.5 MG PO TABS
0.5000 mg | ORAL_TABLET | Freq: Two times a day (BID) | ORAL | 0 refills | Status: AC | PRN
  Filled 2022-09-25: qty 60, 30d supply, fill #0

## 2022-09-29 ENCOUNTER — Other Ambulatory Visit: Payer: Self-pay | Admitting: Family Medicine

## 2022-09-29 ENCOUNTER — Other Ambulatory Visit (HOSPITAL_COMMUNITY): Payer: Self-pay

## 2022-09-29 DIAGNOSIS — Z1231 Encounter for screening mammogram for malignant neoplasm of breast: Secondary | ICD-10-CM

## 2022-09-29 MED ORDER — ALPRAZOLAM 0.5 MG PO TABS
0.5000 mg | ORAL_TABLET | Freq: Two times a day (BID) | ORAL | 5 refills | Status: AC
  Filled 2022-10-22: qty 60, 30d supply, fill #0
  Filled 2022-11-30: qty 60, 30d supply, fill #1
  Filled 2023-01-05 (×2): qty 60, 30d supply, fill #2
  Filled 2023-02-01: qty 60, 30d supply, fill #3
  Filled 2023-03-09: qty 60, 30d supply, fill #4

## 2022-09-29 MED ORDER — VENLAFAXINE HCL 75 MG PO TABS
150.0000 mg | ORAL_TABLET | Freq: Every day | ORAL | 3 refills | Status: DC
Start: 2022-09-29 — End: 2023-10-19
  Filled 2022-09-29: qty 60, 30d supply, fill #0
  Filled 2022-11-03: qty 60, 30d supply, fill #1
  Filled 2022-12-03: qty 60, 30d supply, fill #2
  Filled 2023-01-05 (×2): qty 60, 30d supply, fill #3
  Filled 2023-02-01 – 2023-02-04 (×2): qty 60, 30d supply, fill #4
  Filled 2023-03-06: qty 60, 30d supply, fill #5
  Filled 2023-04-05: qty 60, 30d supply, fill #6
  Filled 2023-05-03: qty 60, 30d supply, fill #7
  Filled 2023-06-02: qty 60, 30d supply, fill #8
  Filled 2023-07-06 – 2023-07-08 (×2): qty 60, 30d supply, fill #9
  Filled 2023-08-02: qty 60, 30d supply, fill #10
  Filled 2023-09-04: qty 60, 30d supply, fill #11

## 2022-09-29 MED ORDER — ZOLPIDEM TARTRATE 10 MG PO TABS
10.0000 mg | ORAL_TABLET | Freq: Every day | ORAL | 5 refills | Status: AC
  Filled 2022-10-22 – 2022-10-30 (×2): qty 30, 30d supply, fill #0
  Filled 2022-12-03: qty 30, 30d supply, fill #1
  Filled 2023-01-05 (×2): qty 30, 30d supply, fill #2
  Filled 2023-02-01: qty 30, 30d supply, fill #3
  Filled 2023-03-04: qty 30, 30d supply, fill #4

## 2022-10-01 ENCOUNTER — Other Ambulatory Visit (HOSPITAL_COMMUNITY): Payer: Self-pay

## 2022-10-02 ENCOUNTER — Other Ambulatory Visit (HOSPITAL_COMMUNITY): Payer: Self-pay

## 2022-10-02 ENCOUNTER — Other Ambulatory Visit: Payer: Self-pay

## 2022-10-15 ENCOUNTER — Ambulatory Visit
Admission: RE | Admit: 2022-10-15 | Discharge: 2022-10-15 | Disposition: A | Discharge status: Home / Self Care | Payer: Commercial Managed Care - PPO | Source: Ambulatory Visit | Attending: Family Medicine | Admitting: Family Medicine

## 2022-10-15 ENCOUNTER — Ambulatory Visit: Payer: Commercial Managed Care - PPO

## 2022-10-15 DIAGNOSIS — Z1231 Encounter for screening mammogram for malignant neoplasm of breast: Secondary | ICD-10-CM

## 2022-10-22 ENCOUNTER — Other Ambulatory Visit (HOSPITAL_COMMUNITY): Payer: Self-pay

## 2022-10-22 ENCOUNTER — Other Ambulatory Visit (HOSPITAL_BASED_OUTPATIENT_CLINIC_OR_DEPARTMENT_OTHER): Payer: Self-pay

## 2022-10-23 ENCOUNTER — Other Ambulatory Visit: Payer: Self-pay

## 2022-10-28 ENCOUNTER — Other Ambulatory Visit (HOSPITAL_COMMUNITY): Payer: Self-pay

## 2022-10-30 ENCOUNTER — Other Ambulatory Visit (HOSPITAL_COMMUNITY): Payer: Self-pay

## 2022-10-31 ENCOUNTER — Other Ambulatory Visit (HOSPITAL_COMMUNITY): Payer: Self-pay

## 2022-11-03 ENCOUNTER — Other Ambulatory Visit (HOSPITAL_COMMUNITY): Payer: Self-pay

## 2022-11-04 ENCOUNTER — Other Ambulatory Visit (HOSPITAL_COMMUNITY): Payer: Self-pay

## 2022-12-01 ENCOUNTER — Other Ambulatory Visit: Payer: Self-pay

## 2022-12-01 ENCOUNTER — Other Ambulatory Visit (HOSPITAL_COMMUNITY): Payer: Self-pay

## 2022-12-01 MED ORDER — NEOMYCIN-POLYMYXIN-DEXAMETH 0.1 % OP SUSP
OPHTHALMIC | 0 refills | Status: AC
  Filled 2022-12-01: qty 5, 10d supply, fill #0

## 2022-12-04 ENCOUNTER — Other Ambulatory Visit: Payer: Self-pay

## 2022-12-04 ENCOUNTER — Other Ambulatory Visit (HOSPITAL_COMMUNITY): Payer: Self-pay

## 2023-01-05 ENCOUNTER — Other Ambulatory Visit (HOSPITAL_COMMUNITY): Payer: Self-pay

## 2023-02-01 ENCOUNTER — Other Ambulatory Visit (HOSPITAL_COMMUNITY): Payer: Self-pay

## 2023-02-02 ENCOUNTER — Other Ambulatory Visit: Payer: Self-pay

## 2023-02-02 ENCOUNTER — Other Ambulatory Visit (HOSPITAL_COMMUNITY): Payer: Self-pay

## 2023-02-03 ENCOUNTER — Other Ambulatory Visit (HOSPITAL_COMMUNITY): Payer: Self-pay

## 2023-02-04 ENCOUNTER — Other Ambulatory Visit: Payer: Self-pay

## 2023-02-04 ENCOUNTER — Other Ambulatory Visit (HOSPITAL_COMMUNITY): Payer: Self-pay

## 2023-03-05 ENCOUNTER — Other Ambulatory Visit: Payer: Self-pay

## 2023-03-06 ENCOUNTER — Other Ambulatory Visit (HOSPITAL_COMMUNITY): Payer: Self-pay

## 2023-03-09 ENCOUNTER — Other Ambulatory Visit (HOSPITAL_COMMUNITY): Payer: Self-pay

## 2023-03-09 ENCOUNTER — Other Ambulatory Visit: Payer: Self-pay

## 2023-03-09 MED ORDER — AMLODIPINE BESYLATE 2.5 MG PO TABS
2.5000 mg | ORAL_TABLET | Freq: Every day | ORAL | 1 refills | Status: DC
  Filled 2023-03-09: qty 30, 30d supply, fill #0
  Filled 2023-04-05: qty 30, 30d supply, fill #1

## 2023-03-10 ENCOUNTER — Other Ambulatory Visit (HOSPITAL_COMMUNITY): Payer: Self-pay

## 2023-03-25 ENCOUNTER — Other Ambulatory Visit (HOSPITAL_COMMUNITY): Payer: Self-pay

## 2023-03-25 MED ORDER — ALPRAZOLAM 0.5 MG PO TABS
0.5000 mg | ORAL_TABLET | Freq: Two times a day (BID) | ORAL | 5 refills | Status: DC | PRN
  Filled 2023-03-25 – 2023-04-08 (×3): qty 60, 30d supply, fill #0
  Filled 2023-05-06: qty 60, 30d supply, fill #1
  Filled 2023-06-08: qty 60, 30d supply, fill #2
  Filled 2023-07-01 – 2023-07-03 (×2): qty 60, 30d supply, fill #3
  Filled 2023-07-30 – 2023-07-31 (×2): qty 60, 30d supply, fill #4
  Filled 2023-08-31: qty 60, 30d supply, fill #5

## 2023-03-25 MED ORDER — ZOLPIDEM TARTRATE 10 MG PO TABS
10.0000 mg | ORAL_TABLET | Freq: Every evening | ORAL | 5 refills | Status: DC | PRN
  Filled 2023-03-25 – 2023-04-05 (×2): qty 30, 30d supply, fill #0
  Filled 2023-05-03: qty 30, 30d supply, fill #1
  Filled 2023-06-15 – 2023-06-25 (×2): qty 30, 30d supply, fill #2
  Filled 2023-08-17 – 2023-08-18 (×2): qty 30, 30d supply, fill #3

## 2023-03-26 ENCOUNTER — Other Ambulatory Visit (HOSPITAL_COMMUNITY): Payer: Self-pay

## 2023-04-05 ENCOUNTER — Other Ambulatory Visit: Payer: Self-pay

## 2023-04-06 ENCOUNTER — Other Ambulatory Visit: Payer: Self-pay

## 2023-04-06 ENCOUNTER — Other Ambulatory Visit (HOSPITAL_COMMUNITY): Payer: Self-pay

## 2023-04-08 ENCOUNTER — Other Ambulatory Visit: Payer: Self-pay

## 2023-04-08 ENCOUNTER — Other Ambulatory Visit (HOSPITAL_COMMUNITY): Payer: Self-pay

## 2023-05-03 ENCOUNTER — Other Ambulatory Visit (HOSPITAL_COMMUNITY): Payer: Self-pay

## 2023-05-04 ENCOUNTER — Other Ambulatory Visit (HOSPITAL_COMMUNITY): Payer: Self-pay

## 2023-05-04 MED ORDER — AMLODIPINE BESYLATE 2.5 MG PO TABS
2.5000 mg | ORAL_TABLET | Freq: Every day | ORAL | 1 refills | Status: AC
  Filled 2023-05-04: qty 30, 30d supply, fill #0
  Filled 2023-06-01: qty 30, 30d supply, fill #1

## 2023-05-07 ENCOUNTER — Other Ambulatory Visit: Payer: Self-pay

## 2023-05-07 ENCOUNTER — Other Ambulatory Visit (HOSPITAL_COMMUNITY): Payer: Self-pay

## 2023-06-01 ENCOUNTER — Other Ambulatory Visit (HOSPITAL_COMMUNITY): Payer: Self-pay

## 2023-06-02 ENCOUNTER — Other Ambulatory Visit (HOSPITAL_COMMUNITY): Payer: Self-pay

## 2023-06-08 ENCOUNTER — Other Ambulatory Visit (HOSPITAL_COMMUNITY): Payer: Self-pay

## 2023-06-16 ENCOUNTER — Other Ambulatory Visit: Payer: Self-pay

## 2023-06-21 ENCOUNTER — Other Ambulatory Visit (HOSPITAL_COMMUNITY): Payer: Self-pay

## 2023-06-24 ENCOUNTER — Other Ambulatory Visit (HOSPITAL_COMMUNITY): Payer: Self-pay

## 2023-06-25 ENCOUNTER — Other Ambulatory Visit (HOSPITAL_COMMUNITY): Payer: Self-pay

## 2023-06-26 ENCOUNTER — Other Ambulatory Visit (HOSPITAL_COMMUNITY): Payer: Self-pay

## 2023-07-02 ENCOUNTER — Other Ambulatory Visit (HOSPITAL_COMMUNITY): Payer: Self-pay

## 2023-07-03 ENCOUNTER — Other Ambulatory Visit (HOSPITAL_COMMUNITY): Payer: Self-pay

## 2023-07-06 ENCOUNTER — Other Ambulatory Visit (HOSPITAL_COMMUNITY): Payer: Self-pay

## 2023-07-07 ENCOUNTER — Other Ambulatory Visit (HOSPITAL_COMMUNITY): Payer: Self-pay

## 2023-07-08 ENCOUNTER — Other Ambulatory Visit (HOSPITAL_COMMUNITY): Payer: Self-pay

## 2023-07-09 ENCOUNTER — Other Ambulatory Visit (HOSPITAL_COMMUNITY): Payer: Self-pay

## 2023-07-15 ENCOUNTER — Other Ambulatory Visit (HOSPITAL_COMMUNITY): Payer: Self-pay

## 2023-07-15 MED ORDER — DOXYCYCLINE 40 MG PO CPDR
40.0000 mg | DELAYED_RELEASE_CAPSULE | Freq: Every morning | ORAL | 3 refills | Status: AC
  Filled 2023-07-15: qty 30, 30d supply, fill #0

## 2023-07-16 ENCOUNTER — Other Ambulatory Visit (HOSPITAL_COMMUNITY): Payer: Self-pay

## 2023-07-24 ENCOUNTER — Other Ambulatory Visit (HOSPITAL_COMMUNITY): Payer: Self-pay

## 2023-07-29 ENCOUNTER — Other Ambulatory Visit (HOSPITAL_COMMUNITY): Payer: Self-pay

## 2023-07-29 MED ORDER — DOXYCYCLINE HYCLATE 50 MG PO CAPS
ORAL_CAPSULE | ORAL | 0 refills | Status: AC
  Filled 2023-07-29: qty 30, 30d supply, fill #0
  Filled 2023-08-31: qty 30, 30d supply, fill #1
  Filled 2023-11-13: qty 30, 30d supply, fill #2

## 2023-07-30 ENCOUNTER — Other Ambulatory Visit: Payer: Self-pay

## 2023-07-30 ENCOUNTER — Other Ambulatory Visit (HOSPITAL_COMMUNITY): Payer: Self-pay

## 2023-08-05 ENCOUNTER — Other Ambulatory Visit: Payer: Self-pay

## 2023-08-18 ENCOUNTER — Other Ambulatory Visit: Payer: Self-pay

## 2023-08-18 ENCOUNTER — Other Ambulatory Visit (HOSPITAL_COMMUNITY): Payer: Self-pay

## 2023-08-31 ENCOUNTER — Other Ambulatory Visit: Payer: Self-pay

## 2023-09-04 ENCOUNTER — Other Ambulatory Visit (HOSPITAL_COMMUNITY): Payer: Self-pay

## 2023-09-24 ENCOUNTER — Other Ambulatory Visit (HOSPITAL_COMMUNITY): Payer: Self-pay

## 2023-09-24 MED ORDER — ALPRAZOLAM 0.5 MG PO TABS
0.5000 mg | ORAL_TABLET | Freq: Two times a day (BID) | ORAL | 5 refills | Status: DC
  Filled 2023-09-24 – 2023-09-28 (×3): qty 60, 30d supply, fill #0
  Filled 2023-10-20 – 2023-10-21 (×2): qty 60, 30d supply, fill #1
  Filled 2023-11-19: qty 60, 30d supply, fill #2
  Filled 2023-12-17: qty 60, 30d supply, fill #3
  Filled 2024-01-13: qty 60, 30d supply, fill #4
  Filled 2024-02-22: qty 60, 30d supply, fill #5

## 2023-09-25 ENCOUNTER — Other Ambulatory Visit (HOSPITAL_COMMUNITY): Payer: Self-pay

## 2023-09-27 ENCOUNTER — Other Ambulatory Visit (HOSPITAL_COMMUNITY): Payer: Self-pay

## 2023-09-28 ENCOUNTER — Other Ambulatory Visit (HOSPITAL_COMMUNITY): Payer: Self-pay

## 2023-09-28 MED ORDER — ZOLPIDEM TARTRATE 10 MG PO TABS
10.0000 mg | ORAL_TABLET | Freq: Every day | ORAL | 5 refills | Status: AC
  Filled 2023-09-28: qty 30, 30d supply, fill #0
  Filled 2023-11-29 – 2023-12-01 (×2): qty 30, 30d supply, fill #1
  Filled 2024-01-14: qty 30, 30d supply, fill #2
  Filled 2024-02-26: qty 30, 30d supply, fill #3

## 2023-10-01 ENCOUNTER — Other Ambulatory Visit (HOSPITAL_COMMUNITY): Payer: Self-pay

## 2023-10-02 ENCOUNTER — Other Ambulatory Visit (HOSPITAL_COMMUNITY): Payer: Self-pay

## 2023-10-02 MED ORDER — VENLAFAXINE HCL 75 MG PO TABS
75.0000 mg | ORAL_TABLET | Freq: Every day | ORAL | 1 refills | Status: AC
  Filled 2023-10-02: qty 30, 30d supply, fill #0

## 2023-10-03 ENCOUNTER — Other Ambulatory Visit (HOSPITAL_COMMUNITY): Payer: Self-pay

## 2023-10-17 ENCOUNTER — Other Ambulatory Visit (HOSPITAL_COMMUNITY): Payer: Self-pay

## 2023-10-17 MED ORDER — VENLAFAXINE HCL 75 MG PO TABS
75.0000 mg | ORAL_TABLET | Freq: Every day | ORAL | 3 refills | Status: AC
  Filled 2023-10-17: qty 30, 30d supply, fill #0

## 2023-10-19 ENCOUNTER — Other Ambulatory Visit (HOSPITAL_COMMUNITY): Payer: Self-pay

## 2023-10-19 ENCOUNTER — Other Ambulatory Visit (HOSPITAL_BASED_OUTPATIENT_CLINIC_OR_DEPARTMENT_OTHER): Payer: Self-pay

## 2023-10-19 MED ORDER — VENLAFAXINE HCL 75 MG PO TABS
150.0000 mg | ORAL_TABLET | Freq: Every day | ORAL | 3 refills | Status: AC
  Filled 2023-10-19: qty 60, 30d supply, fill #0
  Filled 2023-11-13: qty 60, 30d supply, fill #1
  Filled 2023-12-13: qty 60, 30d supply, fill #2
  Filled 2024-01-05: qty 60, 30d supply, fill #3
  Filled 2024-01-15: qty 60, 30d supply, fill #4
  Filled 2024-03-08: qty 60, 30d supply, fill #5

## 2023-10-20 ENCOUNTER — Other Ambulatory Visit: Payer: Self-pay | Admitting: Physician Assistant

## 2023-10-20 ENCOUNTER — Other Ambulatory Visit (HOSPITAL_COMMUNITY): Payer: Self-pay

## 2023-10-20 DIAGNOSIS — Z1231 Encounter for screening mammogram for malignant neoplasm of breast: Secondary | ICD-10-CM

## 2023-10-21 ENCOUNTER — Other Ambulatory Visit (HOSPITAL_COMMUNITY): Payer: Self-pay

## 2023-10-29 ENCOUNTER — Other Ambulatory Visit (HOSPITAL_COMMUNITY): Payer: Self-pay

## 2023-11-12 LAB — COLOGUARD: COLOGUARD: POSITIVE — AB

## 2023-11-13 ENCOUNTER — Other Ambulatory Visit: Payer: Self-pay

## 2023-11-13 ENCOUNTER — Other Ambulatory Visit (HOSPITAL_COMMUNITY): Payer: Self-pay

## 2023-11-19 ENCOUNTER — Encounter (HOSPITAL_COMMUNITY): Payer: Self-pay

## 2023-11-20 ENCOUNTER — Encounter (HOSPITAL_COMMUNITY): Payer: Self-pay

## 2023-11-20 ENCOUNTER — Other Ambulatory Visit: Payer: Self-pay

## 2023-11-20 ENCOUNTER — Other Ambulatory Visit (HOSPITAL_COMMUNITY): Payer: Self-pay

## 2023-12-01 ENCOUNTER — Encounter (HOSPITAL_COMMUNITY): Payer: Self-pay

## 2023-12-01 ENCOUNTER — Other Ambulatory Visit (HOSPITAL_COMMUNITY): Payer: Self-pay

## 2023-12-14 ENCOUNTER — Encounter (HOSPITAL_COMMUNITY): Payer: Self-pay

## 2023-12-14 ENCOUNTER — Other Ambulatory Visit (HOSPITAL_COMMUNITY): Payer: Self-pay

## 2023-12-17 ENCOUNTER — Other Ambulatory Visit (HOSPITAL_COMMUNITY): Payer: Self-pay

## 2024-01-05 ENCOUNTER — Other Ambulatory Visit (HOSPITAL_COMMUNITY): Payer: Self-pay

## 2024-01-07 ENCOUNTER — Other Ambulatory Visit (HOSPITAL_COMMUNITY): Payer: Self-pay

## 2024-01-07 ENCOUNTER — Ambulatory Visit
Admission: RE | Admit: 2024-01-07 | Discharge: 2024-01-07 | Disposition: A | Payer: PRIVATE HEALTH INSURANCE | Source: Ambulatory Visit | Attending: Physician Assistant | Admitting: Physician Assistant

## 2024-01-07 DIAGNOSIS — Z1231 Encounter for screening mammogram for malignant neoplasm of breast: Secondary | ICD-10-CM

## 2024-01-14 ENCOUNTER — Other Ambulatory Visit (HOSPITAL_COMMUNITY): Payer: Self-pay

## 2024-01-14 ENCOUNTER — Encounter (HOSPITAL_COMMUNITY): Payer: Self-pay

## 2024-01-15 ENCOUNTER — Other Ambulatory Visit (HOSPITAL_COMMUNITY): Payer: Self-pay

## 2024-01-15 ENCOUNTER — Other Ambulatory Visit: Payer: Self-pay

## 2024-02-23 ENCOUNTER — Other Ambulatory Visit: Payer: Self-pay

## 2024-02-23 ENCOUNTER — Other Ambulatory Visit (HOSPITAL_COMMUNITY): Payer: Self-pay

## 2024-02-27 ENCOUNTER — Other Ambulatory Visit (HOSPITAL_COMMUNITY): Payer: Self-pay

## 2024-02-29 ENCOUNTER — Other Ambulatory Visit: Payer: Self-pay

## 2024-03-10 ENCOUNTER — Other Ambulatory Visit (HOSPITAL_COMMUNITY): Payer: Self-pay

## 2024-03-29 ENCOUNTER — Other Ambulatory Visit (HOSPITAL_COMMUNITY): Payer: Self-pay

## 2024-03-31 ENCOUNTER — Other Ambulatory Visit (HOSPITAL_COMMUNITY): Payer: Self-pay

## 2024-03-31 MED ORDER — ALPRAZOLAM 0.5 MG PO TABS
0.5000 mg | ORAL_TABLET | Freq: Two times a day (BID) | ORAL | 0 refills | Status: AC
  Filled 2024-03-31: qty 60, 30d supply, fill #0
